# Patient Record
Sex: Male | Born: 1975 | Race: White | Hispanic: No | Marital: Married | Smoking: Never smoker
Health system: Southern US, Community
[De-identification: ages and names within clinical notes are randomized; demographics above are authoritative.]

## PROBLEM LIST (undated history)

## (undated) DIAGNOSIS — I10 Essential (primary) hypertension: Secondary | ICD-10-CM

## (undated) DIAGNOSIS — M419 Scoliosis, unspecified: Secondary | ICD-10-CM

## (undated) DIAGNOSIS — R569 Unspecified convulsions: Secondary | ICD-10-CM

## (undated) HISTORY — DX: Essential (primary) hypertension: I10

## (undated) HISTORY — DX: Unspecified convulsions: R56.9

## (undated) HISTORY — PX: FOOT SURGERY: SHX648

## (undated) HISTORY — PX: LUMBAR SPINE SURGERY: SHX701

## (undated) HISTORY — DX: Scoliosis, unspecified: M41.9

## (undated) HISTORY — PX: LEG SURGERY: SHX1003

## (undated) HISTORY — PX: OPEN ANTERIOR SHOULDER RECONSTRUCTION: SHX2100

## (undated) HISTORY — PX: BLADDER SURGERY: SHX569

---

## 2002-02-04 ENCOUNTER — Encounter: Payer: Self-pay | Admitting: Family Medicine

## 2002-02-04 ENCOUNTER — Encounter: Admission: RE | Admit: 2002-02-04 | Discharge: 2002-02-04 | Payer: Self-pay | Admitting: Family Medicine

## 2002-02-13 ENCOUNTER — Encounter: Payer: Self-pay | Admitting: Family Medicine

## 2002-02-13 ENCOUNTER — Encounter: Admission: RE | Admit: 2002-02-13 | Discharge: 2002-02-13 | Payer: Self-pay | Admitting: Family Medicine

## 2003-08-16 ENCOUNTER — Emergency Department (HOSPITAL_COMMUNITY): Admission: EM | Admit: 2003-08-16 | Discharge: 2003-08-16 | Payer: Self-pay | Admitting: Emergency Medicine

## 2009-01-28 ENCOUNTER — Emergency Department (HOSPITAL_COMMUNITY): Admission: EM | Admit: 2009-01-28 | Discharge: 2009-01-28 | Payer: Self-pay | Admitting: Emergency Medicine

## 2014-06-02 ENCOUNTER — Telehealth: Payer: Self-pay | Admitting: Neurology

## 2014-06-02 NOTE — Telephone Encounter (Signed)
Pt called an canceled new patient appt and did not resch. Referring dr office notified to

## 2014-06-03 ENCOUNTER — Ambulatory Visit: Payer: Self-pay | Admitting: Neurology

## 2014-11-26 ENCOUNTER — Other Ambulatory Visit: Payer: Self-pay | Admitting: Neurology

## 2014-11-26 DIAGNOSIS — G40109 Localization-related (focal) (partial) symptomatic epilepsy and epileptic syndromes with simple partial seizures, not intractable, without status epilepticus: Secondary | ICD-10-CM

## 2014-12-07 ENCOUNTER — Ambulatory Visit
Admission: RE | Admit: 2014-12-07 | Discharge: 2014-12-07 | Disposition: A | Discharge status: Home / Self Care | Payer: BLUE CROSS/BLUE SHIELD | Source: Ambulatory Visit | Attending: Neurology | Admitting: Neurology

## 2014-12-07 DIAGNOSIS — G40109 Localization-related (focal) (partial) symptomatic epilepsy and epileptic syndromes with simple partial seizures, not intractable, without status epilepticus: Secondary | ICD-10-CM

## 2014-12-07 MED ORDER — GADOBENATE DIMEGLUMINE 529 MG/ML IV SOLN
14.0000 mL | Freq: Once | INTRAVENOUS | Status: AC | PRN
  Administered 2014-12-07: 14 mL via INTRAVENOUS

## 2014-12-12 ENCOUNTER — Other Ambulatory Visit: Payer: Self-pay

## 2015-03-17 ENCOUNTER — Telehealth: Payer: Self-pay | Admitting: Family Medicine

## 2015-03-17 ENCOUNTER — Ambulatory Visit (INDEPENDENT_AMBULATORY_CARE_PROVIDER_SITE_OTHER): Payer: BLUE CROSS/BLUE SHIELD | Admitting: Neurology

## 2015-03-17 ENCOUNTER — Encounter: Payer: Self-pay | Admitting: Neurology

## 2015-03-17 VITALS — BP 108/70 | HR 101 | Resp 16 | Wt 139.0 lb

## 2015-03-17 DIAGNOSIS — M419 Scoliosis, unspecified: Secondary | ICD-10-CM | POA: Diagnosis not present

## 2015-03-17 DIAGNOSIS — G40009 Localization-related (focal) (partial) idiopathic epilepsy and epileptic syndromes with seizures of localized onset, not intractable, without status epilepticus: Secondary | ICD-10-CM

## 2015-03-17 MED ORDER — LEVETIRACETAM ER 500 MG PO TB24: ORAL_TABLET | ORAL | Status: DC

## 2015-03-17 NOTE — Patient Instructions (Signed)
1. Start Levetiracetam ER : Take 1 tablet at bedtime for 1 week, then increase to 2 tablets at bedtime 2. Continue taking Trileptal  2 tabs twice a day, then on week 3, start reducing to 1 tablet twice a day for 1 week, then 1/2 tablet twice a day for 1 week, then stop medication 3. Keep a calendar of your seizures 4. Follow-up in 3 months, call for any problems  Seizure Precautions: 1. If medication has been prescribed for you to prevent seizures, take it exactly as directed.  Do not stop taking the medicine without talking to your doctor first, even if you have not had a seizure in a long time.   2. Avoid activities in which a seizure would cause danger to yourself or to others.  Don't operate dangerous machinery, swim alone, or climb in high or dangerous places, such as on ladders, roofs, or girders.  Do not drive unless your doctor says you may.  3. If you have any warning that you may have a seizure, lay down in a safe place where you can't hurt yourself.    4.  No driving for 6 months from last seizure, as per [facility].   Please refer to the following link on the [employer]ile[employer]'s website for more information: http://www.epilepsyfoundation.org/answerplace/Social/driving/drivingu.cfm   5.  Maintain good sleep hygiene.  6.  Contact your doctor if you have any problems that may be related to the medicine you are taking.  7.  Call 911 and bring the patient back to the ED if:        A.  The seizure lasts longer than 5 minutes.       B.  The patient doesn't awaken shortly after the seizure  C.  The patient has new problems such as difficulty seeing, speaking or moving  D.  The patient was injured during the seizure  E.  The patient has a temperature over 102 F (39C)  F.  The patient vomited and now is having trouble breathing

## 2015-03-17 NOTE — Telephone Encounter (Signed)
Called patient & left msg on his vm to let him know that [facility] on [address] had Keppra XR in stock. Rx was called in to that pharmacy.

## 2015-03-17 NOTE — Progress Notes (Signed)
NEUROLOGY CONSULTATION NOTE  Billy [name] MRN: [id number] DOB: 09/12/1975  Referring provider: Dr. Denton Meek Primary care provider: [facility]  Reason for consult:  seizures  Dear Dr Modesto Charon:  Thank you for your kind referral of Billy [name] for consultation of the above symptoms. Although his history is well known to you, please allow me to reiterate it for the purpose of our medical record. Records and images were personally reviewed where available.  HISTORY OF PRESENT ILLNESS: This is a very pleasant 39 year old right-handed man with a history of severe scoliosis with congenital lower extremity musculoskeletal abnormalities, presenting to establish care for possible seizures. He reports that symptoms started in 2001 and have been fairly stereotyped since then. He would feel that "something just clicks," then he would have intrusive thoughts that he either dreamt of the the past, thoughts he had suppressed and would start remembering, or things that had happened when he was a child. This would last for 30-45 seconds, then feels his heart rate increase, his whole body starts warming up and he breaks into a sweat, then has a sensation to urinate. People around him would not notice the episode, he denies any confusion or unresponsiveness. Most of the time he can stay focused, but other times he does not feel completely focused and has had to excuse himself it they happened at work. He recalls the first event occurred while driving [place]country in [place]29, he had not slept for more than 2 days, he recalls feeling a little confused driving in the dark, he could not see anything and this freaked him out. He had another again while driving and had to pull over, then vomited. He denies any further episodes of vomiting since then. Initially, he was having the episodes 1-2 times a year, but then increased in frequency to once a month, clustering for 2-3 days. He would have 1-2  episodes the first day, then 3-5 episodes the next day, tapering down to 1-2 episodes on day 3, then stopping for another 30 days. Last episode was 02/17/15. After the first 2 episodes, he can have a mild 4/10 pressure-like headache in the frontal regions, with good response to Advil. He cannot identify any clear triggers, he denies any sleep deprivation and rarely drinks alcohol. He had previously been evaluated at [facility] by Dr. Kathrynn Ducking in 2007. Per notes, he was seen by [facility] in 2003 and had an EEG reported as normal. He was told he might have migraines and was started on Zonegran, which caused lethargy. At that time, symptoms were felt to be due to anxiety attacks and he was referred to [facility], however it appears he did not follow through on this. He reports that he is very laid-back and denies anxiety. He did not seek any medical attention for these stereotyped symptoms for 9 years, until January 2016 when he reported waking up with tongue bite with blood on his pillow and urinary incontinence twice at the end of 2015. He was started on uptitration of Trileptal, currently on  BID. He denies any change in the frequency or duration of these events since starting medication. He feels that since starting Trileptal, he has been having more cognitive problems with difficulties multitasking, and being more forgetful. He has always been easily distractible, but recently has forgotten to save pictures he does at work, one time he left a medication on the table. He forgets to grab certain things when getting ready in the morning. He  has also started having dizziness 45-60 minutes after taking Trileptal. He initially had tingling in both hands, which is not as bad but still occurring. He has noticed brief twitching in his extremities, which did not occur prior to starting Trileptal. He would be using the computer mouse then have a brief jerk, but more noticeable in his hands or arms when lying down.   He denies  any  olfactory/gustatory hallucinations, rising epigastric sensation, He lives alone, but denies being told at work of any staring/unresponsive episodes, no gaps in time. He has occasional numbness in his right hand when using the computer mouse. He has infrequent headaches with good response to Advil, no associated nausea/vomiting/photo or phonophobia. He denies any diplopia, dysarthria, dysphagia, neck/back pain. No falls. 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 His right leg has limited movement in the foot.   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 There is no history of febrile convulsions, CNS infections such as meningitis/encephalitis, significant traumatic brain injury, or family history of seizures.  Prior AEDs: Zonisamide EEGs: normal in 2003 per report MRI: I personally reviewed MRI brain with and without contrast done 11/2014 which did not show any acute abnormalities, hippocampi symmetric with no abnormal signal or enhancement. There was mild prominence of the lateral ventricles, felt to be normal anatomic variation.   PAST MEDICAL HISTORY: Past Medical History  Diagnosis Date  . Seizures   . Hypertension   . Scoliosis     PAST SURGICAL HISTORY: Past Surgical History  Procedure Laterality Date  . Open anterior shoulder reconstruction      Left  . Lumbar spine surgery      x 2  . Bladder surgery    . Leg surgery      left calf  . Foot surgery      left    MEDICATIONS: Trileptal 300mg  2 tabs BID Norvasc 5mg  daily Lisinopril 10mg  daily   No current facility-administered medications on file prior to visit.    ALLERGIES: No Known Allergies  FAMILY HISTORY: No family history on file.  SOCIAL HISTORY: Social History   Social History  . Marital Status: Married    Spouse Name: N/A  . Number of Children: N/A  .  Years of Education: N/A   Occupational History  . Eye Care    Social History Main Topics  . Smoking status: Never Smoker   . Smokeless tobacco: Never Used  . Alcohol Use: 0.0 oz/week    0 Standard drinks or equivalent per week     Comment: Rare  . Drug Use: No  . Sexual Activity: Not on file   Other Topics Concern  . Not on file   Social History Narrative    REVIEW OF SYSTEMS: Constitutional: No fevers, chills, or sweats, no generalized fatigue, change in appetite Eyes: No visual changes, double vision, eye pain Ear, nose and throat: No hearing loss, ear pain, nasal congestion, sore throat Cardiovascular: No chest pain, palpitations Respiratory:  No shortness of breath at rest or with exertion, wheezes GastrointestinaI: No nausea, vomiting, diarrhea, abdominal pain, fecal incontinence Genitourinary:  No dysuria, urinary retention or frequency Musculoskeletal:  No neck pain, back pain Integumentary: No rash, pruritus, skin lesions Neurological: as above Psychiatric: No depression, insomnia, anxiety Endocrine: No palpitations, fatigue, diaphoresis, mood swings, change in appetite, change in weight, increased thirst Hematologic/Lymphatic:  No anemia,  purpura, petechiae. Allergic/Immunologic: no itchy/runny eyes, nasal congestion, recent allergic reactions, rashes  PHYSICAL EXAM: Filed Vitals:   03/17/15 1251  BP: 108/70  Pulse: 101  Resp: 16   General: No acute distress Head:  Normocephalic/atraumatic Eyes: Fundoscopic exam shows bilateral sharp discs, no vessel changes, exudates, or hemorrhages Neck: supple, no paraspinal tenderness, full range of motion Back: No paraspinal tenderness Heart: regular rate and rhythm Lungs: Clear to auscultation bilaterally. Vascular: No carotid bruits. Skin/Extremities: No rash, no edema; severe atrophy in both LE, left > right calf Neurological Exam: Mental status: alert and oriented to person, place, and time, no dysarthria or  aphasia, Fund of knowledge is appropriate.  Recent and remote memory are intact. 2/3 delayed recall.  Attention and concentration are normal.    Able to name objects and repeat phrases. Cranial nerves: CN I: not tested CN II: pupils equal, round and reactive to light, visual fields intact, fundi unremarkable. CN III, IV, VI:  full range of motion, no nystagmus, no ptosis CN V: facial sensation intact CN VII: upper and lower face symmetric CN VIII: hearing intact to finger rub CN IX, X: gag intact, uvula midline CN XI: sternocleidomastoid and trapezius muscles intact CN XII: tongue midline Bulk & Tone: normal, no fasciculations. Motor: 5/5 on both UE, hip flexors 4/5 bilaterally, knee flexors/extensors, hip adduction/abduction 5/5, 0/5 left foot dorsiflexion and plantarflexion, 2/5 right foot dorsi/plantarflexion Sensation: intact to light touch, cold, pin, vibration sense. Reduced joint position sense.  No extinction to double simultaneous stimulation.  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 At the end of 2015, he woke up with tongue bite and incontinence twice, concerning for possible unwitnessed convulsions. He denies any similar nocturnal episodes since starting Trileptal, however the monthly stereotyped sensory spells are unchanged. He is having side effects on Trileptal and is interested in trying a different medication. Options were discussed, he will start Levetiracetam ER 500mg  qhs for 1  week, then increase to 1000mg  qhs. After a week on higher dose, he will start tapering off Trileptal. Side effects of Levetiracetam were discussed. If symptoms continue despite adequate doses of seizure medication, EEG will be ordered to further classify his events. [place] driving laws were discussed with the patient, and he knows to stop driving after an episode of loss of awareness/consciousness, until 6 months event-free. He will keep a calendar of his symptoms and will follow-up in 3 months. He knows to call our office for any problems in the interim.  Thank you for allowing me to participate in the care of this patient. Please do not hesitate to call for any questions or concerns.   Patrcia Dolly, M.D.  CC: Dr. Modesto Charon

## 2015-03-18 DIAGNOSIS — G40009 Localization-related (focal) (partial) idiopathic epilepsy and epileptic syndromes with seizures of localized onset, not intractable, without status epilepticus: Secondary | ICD-10-CM | POA: Insufficient documentation

## 2015-03-18 DIAGNOSIS — M419 Scoliosis, unspecified: Secondary | ICD-10-CM | POA: Insufficient documentation

## 2015-04-21 ENCOUNTER — Ambulatory Visit: Payer: BLUE CROSS/BLUE SHIELD | Admitting: Neurology

## 2015-06-18 ENCOUNTER — Ambulatory Visit: Payer: BLUE CROSS/BLUE SHIELD | Admitting: Neurology

## 2015-10-09 ENCOUNTER — Ambulatory Visit: Payer: BLUE CROSS/BLUE SHIELD | Admitting: Neurology

## 2015-10-12 ENCOUNTER — Ambulatory Visit (INDEPENDENT_AMBULATORY_CARE_PROVIDER_SITE_OTHER): Payer: BLUE CROSS/BLUE SHIELD | Admitting: Neurology

## 2015-10-12 ENCOUNTER — Encounter: Payer: Self-pay | Admitting: Neurology

## 2015-10-12 VITALS — BP 100/82 | HR 72 | Resp 14 | Wt 132.0 lb

## 2015-10-12 DIAGNOSIS — M419 Scoliosis, unspecified: Secondary | ICD-10-CM

## 2015-10-12 DIAGNOSIS — G40009 Localization-related (focal) (partial) idiopathic epilepsy and epileptic syndromes with seizures of localized onset, not intractable, without status epilepticus: Secondary | ICD-10-CM

## 2015-10-12 MED ORDER — LEVETIRACETAM ER 500 MG PO TB24: ORAL_TABLET | ORAL | Status: DC

## 2015-10-12 NOTE — Progress Notes (Signed)
NEUROLOGY FOLLOW UP OFFICE NOTE  Billy [name] [id number]  HISTORY OF PRESENT ILLNESS: I had the pleasure of seeing Billy [name] in follow-up in the neurology clinic on 10/13/2015.  The patient was last seen 7 months ago for recurrent seizures. On his last visit, he was reporting a 15-year history of relatively stereotyped episodes concerning for simple partial seizures with intrusive thoughts, deja vu sensation. At the end of 2015, he woke up with tongue bite and incontinence twice, concerning for possible unwitnessed convulsions. He continued to have these episodes on Trileptal and was switched to Keppra XR. On  dose, the sensations had stopped, and he decided to stay on low dose. He tapered off Trileptal. He was doing well with no symptoms until the beginning of March when he again woke up with urinary incontinence. He then increased to Keppra XR  daily. He denies any side effects on Keppra. He denies any headaches, dizziness, vision changes, focal numbness/tingling/weakness, staring/unresponsive episodes, olfactory/gustatory hallucinations.   HPI 03/17/15: This is a very pleasant 40 yo RH man with a history of severe scoliosis with congenital lower extremity musculoskeletal abnormalities, with a history suggestive of recurrent simple partial seizures. He reports that symptoms started in 2001 and have been fairly stereotyped since then. He would feel that "something just clicks," then he would have intrusive thoughts that he either dreamt of the the past, thoughts he had suppressed and would start remembering, or things that had happened when he was a child. This would last for 30-45 seconds, then feels his heart rate increase, his whole body starts warming up and he breaks into a sweat, then has a sensation to urinate. People around him would not notice the episode, he denies any confusion or unresponsiveness. Most of the time he can stay focused, but other times he does not feel  completely focused and has had to excuse himself it they happened at work. He recalls the first event occurred while driving cross[place]country in 2130, he had not slept for more than 2 days, he recalls feeling a little confused driving in the dark, he could not see anything and this freaked him out. He had another again while driving and had to pull over, then vomited. He denies any further episodes of vomiting since then. Initially, he was having the episodes 1-2 times a year, but then increased in frequency to once a month, clustering for 2-3 days. He would have 1-2 episodes the first day, then 3-5 episodes the next day, tapering down to 1-2 episodes on day 3, then stopping for another 30 days. Last episode was 02/17/15. After the first 2 episodes, he can have a mild 4/10 pressure-like headache in the frontal regions, with good response to Advil. He cannot identify any clear triggers, he denies any sleep deprivation and rarely drinks alcohol. He had previously been evaluated at [facility] by Dr. Kathrynn Ducking in 2007. Per notes, he was seen by [facility] in 2003 and had an EEG reported as normal. He was told he might have migraines and was started on Zonegran, which caused lethargy. At that time, symptoms were felt to be due to anxiety attacks and he was referred to [facility], however it appears he did not follow through on this. He reports that he is very laid-back and denies anxiety. He did not seek any medical attention for these stereotyped symptoms for 9 years, until January 2016 when he reported waking up with tongue bite with blood on his pillow and urinary incontinence twice at  the end of 2015. He was started on uptitration of Trileptal, currently on 600mg  BID. He denies any change in the frequency or duration of these events since starting medication. He feels that since starting Trileptal, he has been having more cognitive problems with difficulties multitasking, and being more forgetful. He has always been easily  distractible, but recently has forgotten to save pictures he does at work, one time he left a medication on the table. He forgets to grab certain things when getting ready in the morning. He has also started having dizziness 45-60 minutes after taking Trileptal. He initially had tingling in both hands, which is not as bad but still occurring. He has noticed brief twitching in his extremities, which did not occur prior to starting Trileptal. He would be using the computer mouse then have a brief jerk, but more noticeable in his hands or arms when lying down.   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 His right leg has limited movement in the foot. There is no history of febrile convulsions, CNS infections such as meningitis/encephalitis, significant traumatic brain injury, or family history of seizures.  Prior AEDs: Zonisamide EEGs: normal in 2003 per report MRI: I personally reviewed MRI brain with and without contrast done 11/2014 which did not show any acute abnormalities, hippocampi symmetric with no abnormal signal or enhancement. There was mild prominence of the lateral ventricles, felt to be normal anatomic variation.  PAST MEDICAL HISTORY: Past Medical History  Diagnosis Date  . Seizures (HCC)   . Hypertension   . Scoliosis     MEDICATIONS: Current Outpatient Prescriptions on File Prior to Visit  Medication Sig Dispense Refill  . levETIRAcetam (KEPPRA XR) 500 MG 24 hr tablet Take 1 tablet at bedtime for 1 week, then increase to 2 tablets at bedtime (Patient taking differently: Take 2 tablets daily) 60 tablet 4   No current facility-administered medications on file prior to visit.    ALLERGIES: No Known Allergies  FAMILY HISTORY: No family history on file.  SOCIAL HISTORY: Social History    Social History  . Marital Status: Married    Spouse Name: N/A  . Number of Children: N/A  . Years of Education: N/A   Occupational History  . Eye Care    Social History Main Topics  . Smoking status: Never Smoker   . Smokeless tobacco: Never Used  . Alcohol Use: 0.0 oz/week    0 Standard drinks or equivalent per week     Comment: Rare  . Drug Use: No  . Sexual Activity: Not on file   Other Topics Concern  . Not on file   Social History Narrative    REVIEW OF SYSTEMS: Constitutional: No fevers, chills, or sweats, no generalized fatigue, change in appetite Eyes: No visual changes, double vision, eye pain Ear, nose and throat: No hearing loss, ear pain, nasal congestion, sore throat Cardiovascular: No chest pain, palpitations Respiratory:  No shortness of breath at rest or with exertion, wheezes GastrointestinaI: No nausea, vomiting, diarrhea, abdominal pain, fecal incontinence Genitourinary:  No dysuria, urinary retention or frequency Musculoskeletal:  No neck pain, back pain Integumentary: No rash, pruritus, skin lesions Neurological: as above Psychiatric: No depression, insomnia, anxiety Endocrine: No palpitations, fatigue, diaphoresis, mood swings, change in appetite, change in weight, increased thirst Hematologic/Lymphatic:  No anemia, purpura, petechiae. Allergic/Immunologic: no  itchy/runny eyes, nasal congestion, recent allergic reactions, rashes  PHYSICAL EXAM: Filed Vitals:   10/12/15 1545  BP: 100/82  Pulse: 72  Resp: 14   General: No acute distress Head: Normocephalic/atraumatic Neck: supple, no paraspinal tenderness, full range of motion Back: No paraspinal tenderness Heart: regular rate and rhythm Lungs: Clear to auscultation bilaterally. Vascular: No carotid bruits. Skin/Extremities: No rash, no edema; severe atrophy in both LE, left > right calf Neurological Exam: Mental status: alert and oriented to person, place, and time, no dysarthria or  aphasia, Fund of knowledge is appropriate. Recent and remote memory are intact. 2/3 delayed recall. Attention and concentration are normal. Able to name objects and repeat phrases. Cranial nerves: CN I: not tested CN II: pupils equal, round and reactive to light, visual fields intact, fundi unremarkable. CN III, IV, VI: full range of motion, no nystagmus, no ptosis CN V: facial sensation intact CN VII: upper and lower face symmetric CN VIII: hearing intact to finger rub CN IX, X: gag intact, uvula midline CN XI: sternocleidomastoid and trapezius muscles intact CN XII: tongue midline Bulk & Tone: normal, no fasciculations. Motor: 5/5 on both UE, hip flexors 4/5 bilaterally, knee flexors/extensors, hip adduction/abduction 5/5, 0/5 left foot dorsiflexion and plantarflexion, 2/5 right foot dorsi/plantarflexion (unchanged) Sensation: intact to light touch. 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 At the end of 2015, he woke up with tongue bite and incontinence twice, concerning for possible unwitnessed convulsions. The stereotyped episodes stopped once he started Keppra XR. He continued on low dose  daily until he woke up with urinary incontinence at the beginning of March, and increased dose to  qhs. Continue current dose and monitor symptoms. If symptoms continue despite adequate doses of seizure medication, EEG will be ordered to further classify his events. [place] driving laws were discussed with the patient, and he knows to stop  driving after an episode of loss of awareness/consciousness, until 6 months event-free. He will follow-up in 6 months.   Thank you for allowing me to participate in his care.  Please do not hesitate to call for any questions or concerns.  The duration of this appointment visit was 25 minutes of face-to-face time with the patient.  Greater than 50% of this time was spent in counseling, explanation of diagnosis, planning of further management, and coordination of care.   Patrcia Dolly, M.D.

## 2015-10-12 NOTE — Patient Instructions (Addendum)
1. Continue Levetiracetam ER 500mg : Take 2 tablets at night 2. Recommend contacting DMV regarding driving restrictions 3. Follow-up in 6 months, call for any changes  Seizure Precautions: 1. If medication has been prescribed for you to prevent seizures, take it exactly as directed.  Do not stop taking the medicine without talking to your doctor first, even if you have not had a seizure in a long time.   2. Avoid activities in which a seizure would cause danger to yourself or to others.  Don't operate dangerous machinery, swim alone, or climb in high or dangerous places, such as on ladders, roofs, or girders.  Do not drive unless your doctor says you may.  3. If you have any warning that you may have a seizure, lay down in a safe place where you can't hurt yourself.    4.  No driving for 6 months from last seizure, [place] [facility].   Please refer to the following link on the [employer]ile[employer] of [employer]'s website for more information: http://www.epilepsyfoundation.org/answerplace/Social/driving/drivingu.cfm   5.  Maintain good sleep hygiene. Avoid alcohol.  6.  Contact your doctor if you have any problems that may be related to the medicine you are taking.  7.  Call 911 and bring the patient back to the ED if:        A.  The seizure lasts longer than 5 minutes.       B.  The patient doesn't awaken shortly after the seizure  C.  The patient has new problems such as difficulty seeing, speaking or moving  D.  The patient was injured during the seizure  E.  The patient has a temperature over 102 F (39C)  F.  The patient vomited and now is having trouble breathing

## 2015-10-13 ENCOUNTER — Encounter: Payer: Self-pay | Admitting: Neurology

## 2016-05-30 ENCOUNTER — Ambulatory Visit (INDEPENDENT_AMBULATORY_CARE_PROVIDER_SITE_OTHER): Payer: BLUE CROSS/BLUE SHIELD | Admitting: Neurology

## 2016-05-30 ENCOUNTER — Encounter: Payer: Self-pay | Admitting: Neurology

## 2016-05-30 VITALS — BP 126/82 | HR 71 | Wt 132.4 lb

## 2016-05-30 DIAGNOSIS — G40009 Localization-related (focal) (partial) idiopathic epilepsy and epileptic syndromes with seizures of localized onset, not intractable, without status epilepticus: Secondary | ICD-10-CM

## 2016-05-30 MED ORDER — LEVETIRACETAM ER 500 MG PO TB24: ORAL_TABLET | ORAL | 3 refills | Status: DC

## 2016-05-30 NOTE — Progress Notes (Signed)
NEUROLOGY FOLLOW UP OFFICE NOTE  Erskin BurnetHarry S Chuong [id number]  HISTORY OF PRESENT ILLNESS: I had the pleasure of seeing Billy [name] in follow-up in the neurology clinic on 05/30/2016.  The patient was last seen 7 months ago for recurrent seizures. He has a history of relatively stereotyped episodes concerning for simple partial seizures with intrusive thoughts, deja vu sensation. At the end of 2015, he woke up with tongue bite and incontinence twice, concerning for possible unwitnessed convulsions. He continued to have these episodes on Trileptal and was switched to Keppra XR. On 500mg  dose, the sensations had stopped, and he decided to stay on low dose. He tapered off Trileptal. He was doing well with no symptoms until the beginning of March when he again woke up with urinary incontinence. He then increased to Keppra XR 1000mg  daily. No further similar episodes. He is doing well with no side effects on Keppra. He denies any headaches, dizziness, vision changes, focal numbness/tingling/weakness, staring/unresponsive episodes, olfactory/gustatory hallucinations. He has occasional falls but no injuries.  HPI 03/17/15: This is a very pleasant 40 yo RH man with a history of severe scoliosis with congenital lower extremity musculoskeletal abnormalities, with a history suggestive of recurrent simple partial seizures. He reports that symptoms started in 2001 and have been fairly stereotyped since then. He would feel that "something just clicks," then he would have intrusive thoughts that he either dreamt of the the past, thoughts he had suppressed and would start remembering, or things that had happened when he was a child. This would last for 30-45 seconds, then feels his heart rate increase, his whole body starts warming up and he breaks into a sweat, then has a sensation to urinate. People around him would not notice the episode, he denies any confusion or unresponsiveness. Most of the time he can stay  focused, but other times he does not feel completely focused and has had to excuse himself it they happened at work. He recalls the first event occurred [place] in [id number], he had not slept for more than 2 days, he recalls feeling a little confused driving in the dark, he could not see anything and this freaked him out. He had another again while driving and had to pull over, then vomited. He denies any further episodes of vomiting since then. Initially, he was having the episodes 1-2 times a year, but then increased in frequency to once a month, clustering for 2-3 days. He would have 1-2 episodes the first day, then 3-5 episodes the next day, tapering down to 1-2 episodes on day 3, then stopping for another 30 days. Last episode was 02/17/15. After the first 2 episodes, he can have a mild 4/10 pressure-like headache in the frontal regions, with good response to Advil. He cannot identify any clear triggers, he denies any sleep deprivation and rarely drinks alcohol. [facility] by Dr[facility] Kathrynn DuckingHurwitz in 2007. Per notes, he was seen by [facility] in 2003 and had an EEG reported as normal. He was told he might have migraines and was started on Zonegran, which caused lethargy. At that time, symptoms were felt to be due to anxiety attacks and he was [facility], however it appears he did not follow through on this. He reports that he is very laid-back and denies anxiety. He did not seek any medical attention for these stereotyped symptoms for 9 years, until January 2016 when he reported waking up with tongue bite with blood on  his pillow and urinary incontinence twice at the end of 2015. He was started on uptitration of Trileptal, currently on 600mg  BID. He denies any change in the frequency or duration of these events since starting medication. He feels that since starting Trileptal, he has been having more cognitive problems with difficulties multitasking, and being more  forgetful. He has always been easily distractible, but recently has forgotten to save pictures he does at work, one time he left a medication on the table. He forgets to grab certain things when getting ready in the morning. He has also started having dizziness 45-60 minutes after taking Trileptal. He initially had tingling in both hands, which is not as bad but still occurring. He has noticed brief twitching in his extremities, which did not occur prior to starting Trileptal. He would be using the computer mouse then have a brief jerk, but more noticeable in his hands or arms when lying down.   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 His right leg has limited movement in the foot. There is no history of febrile convulsions, CNS infections such as meningitis/encephalitis, significant traumatic brain injury, or family history of seizures.  Prior AEDs: Zonisamide EEGs: normal in 2003 per report MRI: I personally reviewed MRI brain with and without contrast done 11/2014 which did not show any acute abnormalities, hippocampi symmetric with no abnormal signal or enhancement. There was mild prominence of the lateral ventricles, felt to be normal anatomic variation.  PAST MEDICAL HISTORY: Past Medical History:  Diagnosis Date  . Hypertension   . Scoliosis   . Seizures (HCC)     MEDICATIONS: Current Outpatient Prescriptions on File Prior to Visit  Medication Sig Dispense Refill  . levETIRAcetam (KEPPRA XR) 500 MG 24 hr tablet Take 2 tablets at bedtime 180 tablet 3   No current facility-administered medications on file prior to visit.     ALLERGIES: No Known Allergies  FAMILY HISTORY: No family history on file.  SOCIAL HISTORY: Social History   Social History  . Marital status: Married    Spouse  name: N/A  . Number of children: N/A  . Years of education: N/A   Occupational History  . [employer]    Social History Main Topics  . Smoking status: Never Smoker  . Smokeless tobacco: Never Used  . Alcohol use 0.0 oz/week     Comment: Rare  . Drug use: No  . Sexual activity: Not on file   Other Topics Concern  . Not on file   Social History Narrative  . No narrative on file    REVIEW OF SYSTEMS: Constitutional: No fevers, chills, or sweats, no generalized fatigue, change in appetite Eyes: No visual changes, double vision, eye pain Ear, nose and throat: No hearing loss, ear pain, nasal congestion, sore throat Cardiovascular: No chest pain, palpitations Respiratory:  No shortness of breath at rest or with exertion, wheezes GastrointestinaI: No nausea, vomiting, diarrhea, abdominal pain, fecal incontinence Genitourinary:  No dysuria, urinary retention or frequency Musculoskeletal:  No neck pain, back pain Integumentary: No rash, pruritus, skin lesions Neurological: as above Psychiatric: No depression, insomnia, anxiety Endocrine: No palpitations, fatigue, diaphoresis, mood swings, change in appetite, change in weight, increased thirst Hematologic/Lymphatic:  No anemia, purpura, petechiae. Allergic/Immunologic: no itchy/runny eyes, nasal congestion, recent allergic reactions, rashes  PHYSICAL EXAM: Vitals:  05/30/16 1559  BP: 126/82  Pulse: 71   General: No acute distress Head: Normocephalic/atraumatic Neck: supple, no paraspinal tenderness, full range of motion Back: No paraspinal tenderness Heart: regular rate and rhythm Lungs: Clear to auscultation bilaterally. Vascular: No carotid bruits. Skin/Extremities: No rash, no edema; severe atrophy in both LE, left > right calf (unchanged) Neurological Exam: Mental status: alert and oriented to person, place, and time, no dysarthria or aphasia, Fund of knowledge is appropriate. Recent and remote memory are intact. 3/3  delayed recall. Attention and concentration are normal. Able to name objects and repeat phrases. Cranial nerves: CN I: not tested CN II: pupils equal, round and reactive to light, visual fields intact, fundi unremarkable. CN III, IV, VI: full range of motion, no nystagmus, no ptosis CN V: facial sensation intact CN VII: upper and lower face symmetric CN VIII: hearing intact to finger rub CN IX, X: gag intact, uvula midline CN XI: sternocleidomastoid and trapezius muscles intact CN XII: tongue midline Bulk & Tone: normal, no fasciculations. Motor: 5/5 on both UE, hip flexors 4/5 bilaterally, knee flexors/extensors, hip adduction/abduction 5/5, 0/5 left foot dorsiflexion and plantarflexion, 2/5 right foot dorsi/plantarflexion (unchanged) Sensation: intact to light touch. Romberg test negative Deep Tendon Reflexes: +2 on both UE, bilateral patella, absent ankle jerks bilaterally Cerebellar: no incoordination on finger to nose testing Gait: wide-based, steppage gait favoring the left leg (similar to prior) Tremor: none  IMPRESSION: This is a pleasant 40 yo RH man with a history of severe scoliosis with bilateral leg deformities, with a history of relatively stereotyped episodes concerning for simple partial seizures. At the end of 2015, he woke up with tongue bite and incontinence twice, concerning for possible unwitnessed convulsions. The stereotyped episodes stopped once he started Keppra XR. He continued on low dose 500mg  daily until he woke up with urinary incontinence at the beginning of March 2017, and increased dose to 1000mg  qhs. No further similar symptoms. Continue current dose. If symptoms continue despite adequate doses of seizure medication, EEG will be ordered to further classify his events. He is aware of [place] driving laws to stop driving after an episode of loss of awareness/consciousness, until 6 months event-free. He will follow-up in 1 year and knows to call for any changes.    Thank you for allowing me to participate in his care.  Please do not hesitate to call for any questions or concerns.  The duration of this appointment visit was 15 minutes of face-to-face time with the patient.  Greater than 50% of this time was spent in counseling, explanation of diagnosis, planning of further management, and coordination of care.   Patrcia DollyKaren Tana Trefry, M.D.    CC: [facility]

## 2016-05-30 NOTE — Patient Instructions (Addendum)
1. Continue Keppra XR 500mg : take 2 tablets daily 2. Follow-up in 1 year, call for any changes  Seizure Precautions: 1. If medication has been prescribed for you to prevent seizures, take it exactly as directed.  Do not stop taking the medicine without talking to your doctor first, even if you have not had a seizure in a long time.   2. Avoid activities in which a seizure would cause danger to yourself or to others.  Don't operate dangerous machinery, swim alone, or climb in high or dangerous places, such as on ladders, roofs, or girders.  Do not drive unless your doctor says you may.  3. If you have any warning that you may have a seizure, lay down in a safe place where you can't hurt yourself.    4.  No driving for 6 months from last seizure, as per [facility] [place] state law.   Please refer to the following link on the Epilepsy Foundation of America's website for more information: http://www.epilepsyfoundation.org/answerplace/Social/driving/drivingu.cfm   5.  Maintain good sleep hygiene. Avoid alcohol.  6.  Contact your doctor if you have any problems that may be related to the medicine you are taking.  7.  Call 911 and bring the patient back to the ED if:        A.  The seizure lasts longer than 5 minutes.       B.  The patient doesn't awaken shortly after the seizure  C.  The patient has new problems such as difficulty seeing, speaking or moving  D.  The patient was injured during the seizure  E.  The patient has a temperature over 102 F (39C)  F.  The patient vomited and now is having trouble breathing

## 2016-11-07 ENCOUNTER — Telehealth: Payer: Self-pay | Admitting: Neurology

## 2016-11-07 NOTE — Telephone Encounter (Signed)
Will forward message to provider. 

## 2016-11-07 NOTE — Telephone Encounter (Signed)
Pt called wanting an appt. - I gave him one for 12/09/16.  He then told me he was having seizures.  1 a day for the last few days right before it is time for his meds.  He has been under a lot of stress lately, but also wondered if his meds needs to be increased. Please advise.

## 2016-11-08 NOTE — Telephone Encounter (Signed)
Is the Keppra making him drowsy? If not, let's increase Keppra XR : Take 1 tab in AM, 2 tabs in PM. If he is drowsy on it, then we will just increase the night dose to 3 tabs qhs. Thanks

## 2016-11-08 NOTE — Telephone Encounter (Signed)
Clld pt - LOVMTC re provider's notations.

## 2016-12-09 ENCOUNTER — Ambulatory Visit: Payer: BLUE CROSS/BLUE SHIELD | Admitting: Neurology

## 2016-12-09 DIAGNOSIS — Z029 Encounter for administrative examinations, unspecified: Secondary | ICD-10-CM

## 2016-12-12 ENCOUNTER — Encounter: Payer: Self-pay | Admitting: Neurology

## 2017-02-10 ENCOUNTER — Other Ambulatory Visit: Payer: Self-pay

## 2017-02-10 DIAGNOSIS — G40009 Localization-related (focal) (partial) idiopathic epilepsy and epileptic syndromes with seizures of localized onset, not intractable, without status epilepticus: Secondary | ICD-10-CM

## 2017-02-10 MED ORDER — LEVETIRACETAM ER 500 MG PO TB24
ORAL_TABLET | ORAL | 3 refills | Status: DC
Start: 2017-02-10 — End: 2017-06-05

## 2017-06-05 ENCOUNTER — Ambulatory Visit: Payer: BLUE CROSS/BLUE SHIELD | Admitting: Neurology

## 2017-06-05 ENCOUNTER — Encounter: Payer: Self-pay | Admitting: Neurology

## 2017-06-05 DIAGNOSIS — G40009 Localization-related (focal) (partial) idiopathic epilepsy and epileptic syndromes with seizures of localized onset, not intractable, without status epilepticus: Secondary | ICD-10-CM

## 2017-06-05 MED ORDER — LEVETIRACETAM ER 500 MG PO TB24: ORAL_TABLET | ORAL | 3 refills | Status: DC

## 2017-06-05 NOTE — Patient Instructions (Signed)
Great seeing you! Continue Keppra XR 500mg : Take 1 tab in AM, 2 tabs in PM. Follow-up in 1 year, call for any changes  Seizure Precautions: 1. If medication has been prescribed for you to prevent seizures, take it exactly as directed.  Do not stop taking the medicine without talking to your doctor first, even if you have not had a seizure in a long time.   2. Avoid activities in which a seizure would cause danger to yourself or to others.  Don't operate dangerous machinery, swim alone, or climb in high or dangerous places, such as on ladders, roofs, or girders.  Do not drive unless your doctor says you may.  3. If you have any warning that you may have a seizure, lay down in a safe place where you can't hurt yourself.    4.  No driving for 6 months from last seizure, as per [facility] [place].   Please refer to the following link on the Epilepsy Foundation of [employer]'s website for more information: http://www.epilepsyfoundation.org/answerplace/Social/driving/drivingu.cfm   5.  Maintain good sleep hygiene. Avoid alcohol.  6.  Contact your doctor if you have any problems that may be related to the medicine you are taking.  7.  Call 911 and bring the patient back to the ED if:        A.  The seizure lasts longer than 5 minutes.       B.  The patient doesn't awaken shortly after the seizure  C.  The patient has new problems such as difficulty seeing, speaking or moving  D.  The patient was injured during the seizure  E.  The patient has a temperature over 102 F (39C)  F.  The patient vomited and now is having trouble breathing

## 2017-06-05 NOTE — Progress Notes (Signed)
NEUROLOGY FOLLOW UP OFFICE NOTE  Billy BurnetHarry S [name] [id number]  HISTORY OF PRESENT ILLNESS: I had the pleasure of seeing Billy [name] in follow-up in the neurology clinic on 06/05/2017.  The patient was last seen a year ago for recurrent seizures. He has a history of relatively stereotyped episodes concerning for simple partial seizures with intrusive thoughts, deja vu sensation. He had unwitnessed events in 2015 and March 2017 concerning for seizure waking up with urinary incontinence. Keppra dose increased, he was on Keppra XR 1000mg  daily when he called our office last 11/07/16 that he was having one seizure a day for the last few days right before it is time for his medications. He was under a lot of stress at that time. He reports they were less than a minute with no loss of awareness. Dose of Keppra increased to 500mg  in AM, 1000mg  in PM. He denies any further seizures since April 2018, no side effects on higher dose. He usually gets 6.5 hours of sleep. He denies any headaches, dizziness, vision changes, focal numbness/tingling/weakness, staring/unresponsive episodes, olfactory/gustatory hallucinations. No falls. He briefly had some mild nagging frontal headaches when he was off BP medications, this resolved with restarting medications.   HPI 03/17/15: This is a very pleasant 41 yo RH man with a history of severe scoliosis with congenital lower extremity musculoskeletal abnormalities, with a history suggestive of recurrent simple partial seizures. He reports that symptoms started in 2001 and have been fairly stereotyped since then. He would feel that "something just clicks," then he would have intrusive thoughts that he either dreamt of the the past, thoughts he had suppressed and would start remembering, or things that had happened when he was a child. This would last for 30-45 seconds, then feels his heart rate increase, his whole body starts warming up and he breaks into a sweat, then has a sensation  to urinate. People around him would not notice the episode, he denies any confusion or unresponsiveness. Most of the time he can stay focused, but other times he does not feel completely focused and has had to excuse himself it they happened at work. He recalls the first event occurred while [place] in [id number], he had not slept for more than 2 days, he recalls feeling a little confused driving in the dark, he could not see anything and this freaked him out. He had another again while driving and had to pull over, then vomited. He denies any further episodes of vomiting since then. Initially, he was having the episodes 1-2 times a year, but then increased in frequency to once a month, clustering for 2-3 days. He would have 1-2 episodes the first day, then 3-5 episodes the next day, tapering down to 1-2 episodes on day 3, then stopping for another 30 days. Last episode was 02/17/15. After the first 2 episodes, he can have a mild 4/10 pressure-like headache in the frontal regions, with good response to Advil. He cannot identify any clear triggers, he denies any sleep deprivation and rarely drinks alcohol. He had [facility] by Dr. Kathrynn DuckingHurwitz in 2007. Per notes, he was seen by [facility] in 2003 and had an EEG reported as normal. He was told he might have migraines and was started on Zonegran, which caused lethargy. At that time, symptoms were felt to be due to anxiety attacks and he was referred to [facility], however it appears he did not follow through on this. He reports that he is very laid-back  and denies anxiety. He did not seek any medical attention for these stereotyped symptoms for 9 years, until January 2016 when he reported waking up with tongue bite with blood on his pillow and urinary incontinence twice at the end of 2015. He was started on uptitration of Trileptal, currently on 600mg  BID. He denies any change in the frequency or duration of these events since starting medication.  He feels that since starting Trileptal, he has been having more cognitive problems with difficulties multitasking, and being more forgetful. He has always been easily distractible, but recently has forgotten to save pictures he does at work, one time he left a medication on the table. He forgets to grab certain things when getting ready in the morning. He has also started having dizziness 45-60 minutes after taking Trileptal. He initially had tingling in both hands, which is not as bad but still occurring. He has noticed brief twitching in his extremities, which did not occur prior to starting Trileptal. He would be using the computer mouse then have a brief jerk, but more noticeable in his hands or arms when lying down.   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 His right leg has limited movement in the foot. There is no history of febrile convulsions, CNS infections such as meningitis/encephalitis, significant traumatic brain injury, or family history of seizures.  Prior AEDs: Zonisamide EEGs: normal in 2003 per report MRI: I personally reviewed MRI brain with and without contrast done 11/2014 which did not show any acute abnormalities, hippocampi symmetric with no abnormal signal or enhancement. There was mild prominence of the lateral ventricles, felt to be normal anatomic variation.  PAST MEDICAL HISTORY: Past Medical History:  Diagnosis Date  . Hypertension   . Scoliosis   . Seizures (HCC)     MEDICATIONS: Current Outpatient Medications on File Prior to Visit  Medication Sig Dispense Refill  . amLODipine (NORVASC) 5 MG tablet TAKE 1 TABLET BY MOUTH EVERY DAY    . levETIRAcetam (KEPPRA XR) 500 MG 24 hr tablet Take 1 tablet by mouth in the morning.  Take 2 tablets by mouth in the evening 270 tablet 3    . lisinopril (PRINIVIL,ZESTRIL) 10 MG tablet TAKE 1 TABLET BY MOUTH EVERY DAY OFFICE VISIT DUE NOW  0   No current facility-administered medications on file prior to visit.     ALLERGIES: No Known Allergies  FAMILY HISTORY: No family history on file.  SOCIAL HISTORY: Social History   Socioeconomic History  . Marital status: Married    Spouse name: Not on file  . Number of children: Not on file  . Years of education: Not on file  . Highest education level: Not on file  Social Needs  . Financial resource strain: Not on file  . Food insecurity - worry: Not on file  . Food insecurity - inability: Not on file  . Transportation needs - medical: Not on file  . Transportation needs - non-medical: Not on file  Occupational History  . 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 Smoking status: Never Smoker  . Smokeless tobacco: Never Used  Substance and Sexual Activity  . Alcohol use: Yes    Alcohol/week: 0.0 oz    Comment: Rare  . Drug use: No  . Sexual activity: Not on file  Other Topics Concern  .  Not on file  Social History Narrative  . Not on file    REVIEW OF SYSTEMS: Constitutional: No fevers, chills, or sweats, no generalized fatigue, change in appetite Eyes: No visual changes, double vision, eye pain Ear, nose and throat: No hearing loss, ear pain, nasal congestion, sore throat Cardiovascular: No chest pain, palpitations Respiratory:  No shortness of breath at rest or with exertion, wheezes GastrointestinaI: No nausea, vomiting, diarrhea, abdominal pain, fecal incontinence Genitourinary:  No dysuria, urinary retention or frequency Musculoskeletal:  No neck pain, back pain Integumentary: No rash, pruritus, skin lesions Neurological: as above Psychiatric: No depression, insomnia, anxiety Endocrine: No palpitations, fatigue, diaphoresis, mood swings, change in appetite, change in weight, increased thirst Hematologic/Lymphatic:  No anemia, purpura,  petechiae. Allergic/Immunologic: no itchy/runny eyes, nasal congestion, recent allergic reactions, rashes  PHYSICAL EXAM: Vitals:   06/05/17 1559  BP: 116/76  Pulse: 99  SpO2: 97%   General: No acute distress Head: Normocephalic/atraumatic Neck: supple, no paraspinal tenderness, full range of motion Back: No paraspinal tenderness Heart: regular rate and rhythm Lungs: Clear to auscultation bilaterally. Vascular: No carotid bruits. Skin/Extremities: No rash, no edema; severe atrophy in both LE, left > right calf (unchanged) Neurological Exam: Mental status: alert and oriented to person, place, and time, no dysarthria or aphasia, Fund of knowledge is appropriate. Recent and remote memory are intact. 3/3 delayed recall. Attention and concentration are normal. Able to name objects and repeat phrases. Cranial nerves: CN I: not tested CN II: pupils equal, round and reactive to light, visual fields intact, fundi unremarkable. CN III, IV, VI: full range of motion, no nystagmus, no ptosis CN V: facial sensation intact CN VII: upper and lower face symmetric CN VIII: hearing intact to finger rub CN IX, X: gag intact, uvula midline CN XI: sternocleidomastoid and trapezius muscles intact CN XII: tongue midline Bulk & Tone: normal, no fasciculations. Motor: 5/5 on both UE, hip flexors 4/5 bilaterally, knee flexors/extensors, hip adduction/abduction 5/5, 0/5 left foot dorsiflexion and plantarflexion, 2/5 right foot dorsi/plantarflexion (unchanged) Sensation: intact to light touch. Romberg test negative Deep Tendon Reflexes: +2 on both UE, bilateral patella, absent ankle jerks bilaterally Cerebellar: no incoordination on finger to nose testing Gait: wide-based, steppage gait favoring the left leg (similar to prior) Tremor: none  IMPRESSION: This is a pleasant 41 yo RH man with a history of severe scoliosis with bilateral leg deformities, with a history of relatively stereotyped episodes  concerning for simple partial seizures. At the end of 2015, he woke up with tongue bite and incontinence twice, concerning for possible unwitnessed convulsions. The stereotyped episodes stopped once he started Keppra XR. He continued on low dose 500mg  daily until he woke up with urinary incontinence at the beginning of March 2017, and increased dose to 1000mg  qhs. Dose again increased to 500mg  in AM, 1000mg  in PM after a few small seizures in April 2018. He has been doing well since, no side effects. If symptoms continue despite adequate doses of seizure medication, EEG will be ordered to further classify his events. He is aware of [place] driving laws to stop driving after an episode of loss of awareness/consciousness, until 6 months event-free. He will follow-up in 1 year and knows to call for any changes.   Thank you for allowing me to participate in his care.  Please do not hesitate to call for any questions or concerns.  The duration of this appointment visit was 15 minutes of face-to-face time with the patient.  Greater than 50% of  this time was spent in counseling, explanation of diagnosis, planning of further management, and coordination of care.   Patrcia Dolly, M.D.    CC: [facility] at [facility]

## 2018-06-04 ENCOUNTER — Ambulatory Visit: Payer: BLUE CROSS/BLUE SHIELD | Admitting: Neurology

## 2018-06-11 ENCOUNTER — Ambulatory Visit
Admission: RE | Admit: 2018-06-11 | Discharge: 2018-06-11 | Disposition: A | Discharge status: Home / Self Care | Payer: 59 | Source: Ambulatory Visit | Attending: Family Medicine | Admitting: Family Medicine

## 2018-06-11 ENCOUNTER — Other Ambulatory Visit: Payer: Self-pay | Admitting: Family Medicine

## 2018-06-11 DIAGNOSIS — R05 Cough: Secondary | ICD-10-CM

## 2018-06-11 DIAGNOSIS — R058 Other specified cough: Secondary | ICD-10-CM

## 2018-06-25 ENCOUNTER — Other Ambulatory Visit: Payer: Self-pay

## 2018-06-25 ENCOUNTER — Ambulatory Visit: Payer: 59 | Admitting: Neurology

## 2018-06-25 ENCOUNTER — Encounter: Payer: Self-pay | Admitting: Neurology

## 2018-06-25 DIAGNOSIS — G40009 Localization-related (focal) (partial) idiopathic epilepsy and epileptic syndromes with seizures of localized onset, not intractable, without status epilepticus: Secondary | ICD-10-CM | POA: Diagnosis not present

## 2018-06-25 MED ORDER — LEVETIRACETAM ER 500 MG PO TB24: ORAL_TABLET | ORAL | 3 refills | Status: DC

## 2018-06-25 NOTE — Patient Instructions (Signed)
Great seeing you! Continue Keppra XR 500mg : Take 1 tablet in AM, 2 tablets in PM. Follow-up in 1 year, call for any changes.  Seizure Precautions: 1. If medication has been prescribed for you to prevent seizures, take it exactly as directed.  Do not stop taking the medicine without talking to your doctor first, even if you have not had a seizure in a long time.   2. Avoid activities in which a seizure would cause danger to yourself or to others.  Don't operate dangerous machinery, swim alone, or climb in high or dangerous places, such as on ladders, roofs, or girders.  Do not drive unless your doctor says you may.  3. If you have any warning that you may have a seizure, lay down in a safe place where you can't hurt yourself.    4.  No driving for 6 months from last seizure, [place].   Please refer to the following link on the [employer]'s website for more information: http://www.epilepsyfoundation.org/answerplace/Social/driving/drivingu.cfm   5.  Maintain good sleep hygiene. Avoid alcohol.  6.  Contact your doctor if you have any problems that may be related to the medicine you are taking.  7.  Call 911 and bring the patient back to the ED if:        A.  The seizure lasts longer than 5 minutes.       B.  The patient doesn't awaken shortly after the seizure  C.  The patient has new problems such as difficulty seeing, speaking or moving  D.  The patient was injured during the seizure  E.  The patient has a temperature over 102 F (39C)  F.  The patient vomited and now is having trouble breathing

## 2018-06-25 NOTE — Progress Notes (Signed)
NEUROLOGY FOLLOW UP OFFICE NOTE  Billy [name] [id number]  DOB: 1976-04-12  HISTORY OF PRESENT ILLNESS: I had the pleasure of seeing Billy [name] in follow-up in the neurology clinic on 06/25/2018.  The patient was last seen a year ago for recurrent seizures. He has a history of relatively stereotyped episodes concerning for simple partial seizures with intrusive thoughts, deja vu sensation. He had unwitnessed events in 2015 and March 2017 concerning for seizure waking up with urinary incontinence. Keppra dose increased, he was on Keppra XR 1000mg  daily when he called our office last 11/07/16 that he was having one seizure a day for the last few days right before it is time for his medications. He was under a lot of stress at that time. He reports they were less than a minute with no loss of awareness. Dose of Keppra increased to 500mg  in AM, 1000mg  in PM.   Since his last visit, he continues to report doing well with no seizures since April 2018. No side effects on Keppra. He denies any staring/unresponsive episodes, gaps in time, olfactory/gustatory hallucinations, focal numbness/tingling, myoclonic jerks. Occasionally his left leg and foot drags leading to minor falls, no injuries. He has occasional headaches that resolve with Advil, around 1-2 times a year he has bad headaches where he has to lay down. Sleep is good. He had a neck injury last month while working at [employer] and started having numbness above his left ear, this has resolved.   History on Initial Assessment 03/17/2015: This is a very pleasant 42 yo RH man with a history of severe scoliosis with congenital lower extremity musculoskeletal abnormalities, with a history suggestive of recurrent simple partial seizures. He reports that symptoms started in 2001 and have been fairly stereotyped since then. He would feel that "something just clicks," then he would have intrusive thoughts that he either dreamt of the the past, thoughts  he had suppressed and would start remembering, or things that had happened when he was a child. This would last for 30-45 seconds, then feels his heart rate increase, his whole body starts warming up and he breaks into a sweat, then has a sensation to urinate. People around him would not notice the episode, he denies any confusion or unresponsiveness. Most of the time he can stay focused, but other times he does not feel completely focused and has had to excuse himself it they happened at work. He recalls the first event occurred while [id number], he had not slept for more than 2 days, he recalls feeling a little confused driving in the dark, he could not see anything and this freaked him out. He had another again while driving and had to pull over, then vomited. He denies any further episodes of vomiting since then. Initially, he was having the episodes 1-2 times a year, but then increased in frequency to once a month, clustering for 2-3 days. He would have 1-2 episodes the first day, then 3-5 episodes the next day, tapering down to 1-2 episodes on day 3, then stopping for another 30 days. Last episode was 02/17/15. After the first 2 episodes, he can have a mild 4/10 pressure-like headache in the frontal regions, with good response to Advil. He cannot identify any clear triggers, he denies any sleep deprivation and rarely drinks alcohol. He had previously been evaluated at [facility] by Dr. Kathrynn Ducking in 2007. Per notes, he was seen by [facility] in 2003 and had an EEG reported as normal.  He was told he might have migraines and was started on Zonegran, which caused lethargy. At that time, symptoms were felt to be due to anxiety attacks and he was referred to [facility], however it appears he did not follow through on this. He reports that he is very laid-back and denies anxiety. He did not seek any medical attention for these stereotyped symptoms for 9 years, until January 2016 when he reported waking up with  tongue bite with blood on his pillow and urinary incontinence twice at the end of 2015. He was started on uptitration of Trileptal, currently on 600mg  BID. He denies any change in the frequency or duration of these events since starting medication. He feels that since starting Trileptal, he has been having more cognitive problems with difficulties multitasking, and being more forgetful. He has always been easily distractible, but recently has forgotten to save pictures he does at work, one time he left a medication on the table. He forgets to grab certain things when getting ready in the morning. He has also started having dizziness 45-60 minutes after taking Trileptal. He initially had tingling in both hands, which is not as bad but still occurring. He has noticed brief twitching in his extremities, which did not occur prior to starting Trileptal. He would be using the computer mouse then have a brief jerk, but more noticeable in his hands or arms when lying down.   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 His right leg has limited movement in the foot. There is no history of febrile convulsions, CNS infections such as meningitis/encephalitis, significant traumatic brain injury, or family history of seizures.  Prior AEDs: Zonisamide EEGs: normal in 2003 per report MRI: I personally reviewed MRI brain with and without contrast done 11/2014 which did not show any acute abnormalities, hippocampi symmetric with no abnormal signal or enhancement. There was mild prominence of the lateral ventricles, felt to be normal anatomic variation.  PAST MEDICAL HISTORY: Past Medical History:  Diagnosis Date  . Hypertension   . Scoliosis   . Seizures (HCC)     MEDICATIONS: Current Outpatient Medications on File Prior to Visit    Medication Sig Dispense Refill  . amLODipine (NORVASC) 5 MG tablet TAKE 1 TABLET BY MOUTH EVERY DAY    . levETIRAcetam (KEPPRA XR) 500 MG 24 hr tablet Take 1 tablet by mouth in the morning.  Take 2 tablets by mouth in the evening 270 tablet 3  . lisinopril (PRINIVIL,ZESTRIL) 10 MG tablet TAKE 1 TABLET BY MOUTH EVERY DAY OFFICE VISIT DUE NOW  0   No current facility-administered medications on file prior to visit.     ALLERGIES: No Known Allergies  FAMILY HISTORY: No family history on file.  SOCIAL HISTORY: Social History   Socioeconomic History  . Marital status: Married    Spouse name: Not on file  . Number of children: Not on file  . Years of education: Not on file  . Highest education level: Not on file  Occupational History  . 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 Financial resource strain: Not on file  . Food insecurity:    Worry: Not on file    Inability: Not on file  . Transportation needs:    Medical: Not on file    Non-medical: Not on file  Tobacco Use  .  Smoking status: Never Smoker  . Smokeless tobacco: Never Used  Substance and Sexual Activity  . Alcohol use: Yes    Alcohol/week: 0.0 standard drinks    Comment: Rare  . Drug use: No  . Sexual activity: Not on file  Lifestyle  . Physical activity:    Days per week: Not on file    Minutes per session: Not on file  . Stress: Not on file  Relationships  . Social connections:    Talks on phone: Not on file    Gets together: Not on file    Attends religious service: Not on file    Active member of club or organization: Not on file    Attends meetings of clubs or organizations: Not on file    Relationship status: Not on file  . Intimate partner violence:    Fear of current or ex partner: Not on file    Emotionally abused: Not on file    Physically abused: Not on file    Forced sexual activity: Not on file  Other Topics Concern  . Not on file  Social History Narrative  . Not on file    REVIEW OF  SYSTEMS: Constitutional: No fevers, chills, or sweats, no generalized fatigue, change in appetite Eyes: No visual changes, double vision, eye pain Ear, nose and throat: No hearing loss, ear pain, nasal congestion, sore throat Cardiovascular: No chest pain, palpitations Respiratory:  No shortness of breath at rest or with exertion, wheezes GastrointestinaI: No nausea, vomiting, diarrhea, abdominal pain, fecal incontinence Genitourinary:  No dysuria, urinary retention or frequency Musculoskeletal:  No neck pain, back pain Integumentary: No rash, pruritus, skin lesions Neurological: as above Psychiatric: No depression, insomnia, anxiety Endocrine: No palpitations, fatigue, diaphoresis, mood swings, change in appetite, change in weight, increased thirst Hematologic/Lymphatic:  No anemia, purpura, petechiae. Allergic/Immunologic: no itchy/runny eyes, nasal congestion, recent allergic reactions, rashes  PHYSICAL EXAM: Vitals:   06/25/18 1413  BP: 120/82  Pulse: 84  SpO2: 98%   General: No acute distress Head: Normocephalic/atraumatic Neck: supple, no paraspinal tenderness, full range of motion Back: No paraspinal tenderness Heart: regular rate and rhythm Lungs: Clear to auscultation bilaterally. Vascular: No carotid bruits. Skin/Extremities: No rash, no edema; severe atrophy in both LE, left > right calf (unchanged) Neurological Exam: Mental status: alert and oriented to person, place, and time, no dysarthria or aphasia, Fund of knowledge is appropriate. Recent and remote memory are intact. Attention and concentration are normal. Able to name objects and repeat phrases. Cranial nerves: CN I: not tested CN II: pupils equal, round and reactive to light, visual fields intact CN III, IV, VI: full range of motion, no nystagmus, no ptosis CN V: facial sensation intact CN VII: upper and lower face symmetric CN VIII: hearing intact to finger rub CN IX, X: gag intact, uvula midline CN  XI: sternocleidomastoid and trapezius muscles intact CN XII: tongue midline Bulk & Tone: normal, no fasciculations. Motor: 5/5 on both UE, hip flexors 4/5 bilaterally, knee flexors/extensors, hip adduction/abduction 5/5, 0/5 left foot dorsiflexion and plantarflexion, 2/5 right foot dorsi/plantarflexion (unchanged) Sensation: intact to light touch. Romberg test negative Deep Tendon Reflexes: +2 on both UE, bilateral patella, absent ankle jerks bilaterally (similar to prior) Cerebellar: no incoordination on finger to nose testing Gait: wide-based, steppage gait favoring the left leg (unchanged) Tremor: none  IMPRESSION: This is a pleasant 42 yo RH man with a history of severe scoliosis with bilateral leg deformities, with a history of relatively stereotyped episodes concerning  for simple partial seizures. At the end of 2015, he woke up with tongue bite and incontinence twice, concerning for possible unwitnessed convulsions. The stereotyped episodes stopped once he started Keppra XR. He continued on low dose 500mg  daily until he woke up with urinary incontinence at the beginning of March 2017, and increased dose to 1000mg  qhs. Dose again increased to 500mg  in AM, 1000mg  in PM after a few small seizures in April 2018. No side effects on Keppra. He has been doing well since then, refills for Keppra sent. He is aware of [place] driving laws to stop driving after an episode of loss of awareness/consciousness, until 6 months event-free. He will follow-up in 1 year and knows to call for any changes.   Thank you for allowing me to participate in his care.  Please do not hesitate to call for any questions or concerns.  The duration of this appointment visit was 15 minutes of face-to-face time with the patient.  Greater than 50% of this time was spent in counseling, explanation of diagnosis, planning of further management, and coordination of care.   Patrcia Dolly, M.D.    CC: [facility] at  [facility]

## 2018-08-28 ENCOUNTER — Other Ambulatory Visit: Payer: Self-pay | Admitting: Neurology

## 2018-08-28 DIAGNOSIS — G40009 Localization-related (focal) (partial) idiopathic epilepsy and epileptic syndromes with seizures of localized onset, not intractable, without status epilepticus: Secondary | ICD-10-CM

## 2018-09-06 ENCOUNTER — Other Ambulatory Visit: Payer: Self-pay | Admitting: Neurology

## 2018-09-06 DIAGNOSIS — G40009 Localization-related (focal) (partial) idiopathic epilepsy and epileptic syndromes with seizures of localized onset, not intractable, without status epilepticus: Secondary | ICD-10-CM

## 2018-09-07 ENCOUNTER — Telehealth: Payer: Self-pay | Admitting: Neurology

## 2018-09-07 NOTE — Telephone Encounter (Signed)
This was already sent this morning

## 2018-09-07 NOTE — Telephone Encounter (Signed)
Left vm about needing generic keppra medication refill. Please send. Thanks!

## 2019-06-02 ENCOUNTER — Other Ambulatory Visit: Payer: Self-pay | Admitting: Neurology

## 2019-06-02 DIAGNOSIS — G40009 Localization-related (focal) (partial) idiopathic epilepsy and epileptic syndromes with seizures of localized onset, not intractable, without status epilepticus: Secondary | ICD-10-CM

## 2019-06-19 ENCOUNTER — Ambulatory Visit: Payer: 59 | Admitting: Neurology

## 2019-06-28 ENCOUNTER — Ambulatory Visit: Payer: 59 | Admitting: Neurology

## 2019-11-10 ENCOUNTER — Other Ambulatory Visit: Payer: Self-pay | Admitting: Neurology

## 2019-11-10 DIAGNOSIS — G40009 Localization-related (focal) (partial) idiopathic epilepsy and epileptic syndromes with seizures of localized onset, not intractable, without status epilepticus: Secondary | ICD-10-CM

## 2019-12-11 ENCOUNTER — Other Ambulatory Visit: Payer: Self-pay | Admitting: Neurology

## 2019-12-11 DIAGNOSIS — G40009 Localization-related (focal) (partial) idiopathic epilepsy and epileptic syndromes with seizures of localized onset, not intractable, without status epilepticus: Secondary | ICD-10-CM

## 2019-12-18 ENCOUNTER — Other Ambulatory Visit: Payer: Self-pay | Admitting: Neurology

## 2019-12-18 DIAGNOSIS — G40009 Localization-related (focal) (partial) idiopathic epilepsy and epileptic syndromes with seizures of localized onset, not intractable, without status epilepticus: Secondary | ICD-10-CM

## 2019-12-19 ENCOUNTER — Other Ambulatory Visit: Payer: Self-pay | Admitting: Neurology

## 2019-12-19 ENCOUNTER — Telehealth: Payer: Self-pay | Admitting: Neurology

## 2019-12-19 DIAGNOSIS — G40009 Localization-related (focal) (partial) idiopathic epilepsy and epileptic syndromes with seizures of localized onset, not intractable, without status epilepticus: Secondary | ICD-10-CM

## 2019-12-19 MED ORDER — LEVETIRACETAM ER 500 MG PO TB24: ORAL_TABLET | ORAL | 7 refills | Status: DC

## 2019-12-19 NOTE — Telephone Encounter (Signed)
Can we refill this until Dec ?

## 2019-12-19 NOTE — Telephone Encounter (Signed)
Patient called in after trying to get his medication filled at the pharmacy and them only giving him half. He didn't realize he didn't have an appointment. I scheduled him today for December 20th. The patient would like a call back about his prescription.

## 2019-12-19 NOTE — Telephone Encounter (Signed)
Patient called and left a message requesting a call back about the refills for his seizure medication. He said he only has one dose left.

## 2019-12-19 NOTE — Telephone Encounter (Signed)
Pls let him  know Rx sent to pharmacy on file, thanks

## 2020-07-20 ENCOUNTER — Encounter: Payer: Self-pay | Admitting: Neurology

## 2020-07-20 ENCOUNTER — Ambulatory Visit: Payer: Self-pay | Admitting: Neurology

## 2020-07-20 DIAGNOSIS — Z029 Encounter for administrative examinations, unspecified: Secondary | ICD-10-CM

## 2020-07-23 ENCOUNTER — Other Ambulatory Visit: Payer: Self-pay | Admitting: Neurology

## 2020-07-23 DIAGNOSIS — G40009 Localization-related (focal) (partial) idiopathic epilepsy and epileptic syndromes with seizures of localized onset, not intractable, without status epilepticus: Secondary | ICD-10-CM

## 2020-07-27 ENCOUNTER — Encounter: Payer: Self-pay | Admitting: Neurology

## 2020-08-12 ENCOUNTER — Other Ambulatory Visit: Payer: Self-pay | Admitting: Neurology

## 2020-08-12 DIAGNOSIS — G40009 Localization-related (focal) (partial) idiopathic epilepsy and epileptic syndromes with seizures of localized onset, not intractable, without status epilepticus: Secondary | ICD-10-CM

## 2020-08-12 NOTE — Telephone Encounter (Signed)
Pls let him know he needs to schedule f/u for further refills, pls let me know when appt is and I will send refills until then, thanks!

## 2020-08-12 NOTE — Telephone Encounter (Signed)
Spoke with patient and was able to sch a f/u for 04/26/21.

## 2021-04-26 ENCOUNTER — Ambulatory Visit: Payer: BC Managed Care – PPO | Admitting: Neurology

## 2021-05-15 ENCOUNTER — Other Ambulatory Visit: Payer: Self-pay | Admitting: Neurology

## 2021-05-15 DIAGNOSIS — G40009 Localization-related (focal) (partial) idiopathic epilepsy and epileptic syndromes with seizures of localized onset, not intractable, without status epilepticus: Secondary | ICD-10-CM

## 2021-05-21 ENCOUNTER — Other Ambulatory Visit: Payer: Self-pay | Admitting: Neurology

## 2021-05-21 DIAGNOSIS — G40009 Localization-related (focal) (partial) idiopathic epilepsy and epileptic syndromes with seizures of localized onset, not intractable, without status epilepticus: Secondary | ICD-10-CM

## 2021-06-01 ENCOUNTER — Encounter: Payer: Self-pay | Admitting: Neurology

## 2021-06-01 ENCOUNTER — Ambulatory Visit: Payer: BC Managed Care – PPO | Admitting: Neurology

## 2021-06-01 ENCOUNTER — Other Ambulatory Visit: Payer: Self-pay

## 2021-06-01 VITALS — BP 134/92 | HR 84 | Ht 60.0 in | Wt 134.6 lb

## 2021-06-01 DIAGNOSIS — G40009 Localization-related (focal) (partial) idiopathic epilepsy and epileptic syndromes with seizures of localized onset, not intractable, without status epilepticus: Secondary | ICD-10-CM

## 2021-06-01 MED ORDER — LEVETIRACETAM ER 500 MG PO TB24
ORAL_TABLET | ORAL | 3 refills | Status: DC
Start: 2021-06-01 — End: 2022-09-01

## 2021-06-01 NOTE — Progress Notes (Signed)
NEUROLOGY FOLLOW UP OFFICE NOTE  Billy [name] [id number] Mar 13, 1976  HISTORY OF PRESENT ILLNESS: I had the pleasure of seeing Surafel Hilleary in follow-up in the neurology clinic on 06/01/2021.  The patient was last seen 3 years ago for seizures. He has a history of relatively stereotyped episodes concerning for simple partial seizures with intrusive thoughts, deja vu sensation. He had unwitnessed events in 2015 and March 2017 concerning for seizure waking up with urinary incontinence. Keppra dose increased, he was on Keppra XR 1000mg  daily when he called our office last 11/07/16 that he was having one seizure a day for the last few days right before it is time for his medications. He was under a lot of stress at that time. He reports they were less than a minute with no loss of awareness. Dose of Keppra increased to 500mg  in AM, 1000mg  in PM. He denies any major seizures since 10/2016. He has had infrequent intrusive thoughts/deja vu lasting 10 seconds with no associated confusion or loss of awareness, occurring 2-3 times a year. He denies any staring/unresponsive episodes, no new focal symptoms. He has headaches that can last several days, he had a nagging headache over the weekend. He has been staying away from NSAIDs and takes Tylenol but it is not as helpful as Advil. He denies any dizziness. He gets around 6 hours of sleep. He works as a . He has noticed his tolerance is lower and he gets aggravated a lot quicker now. Memory is "always horrible," he denies missing medications but last night could not remember if he already took his dose.   History on Initial Assessment 03/17/2015: This is a very pleasant 45 yo RH man with a history of severe scoliosis with congenital lower extremity musculoskeletal abnormalities, with a history suggestive of recurrent simple partial seizures. He reports that symptoms started in 2001 and have been fairly stereotyped since then. He would feel that  "something just clicks," then he would have intrusive thoughts that he either dreamt of the the past, thoughts he had suppressed and would start remembering, or things that had happened when he was a child. This would last for 30-45 seconds, then feels his heart rate increase, his whole body starts warming up and he breaks into a sweat, then has a sensation to urinate. People around him would not notice the episode, he denies any confusion or unresponsiveness. Most of the time he can stay focused, but other times he does not feel completely focused and has had to excuse himself it they happened at work. He recalls the first event occurred while [place], he had not slept for more than 2 days, he recalls feeling a little confused driving in the dark, he could not see anything and this freaked him out. He had another again while driving and had to pull over, then vomited. He denies any further episodes of vomiting since then. Initially, he was having the episodes 1-2 times a year, but then increased in frequency to once a month, clustering for 2-3 days. He would have 1-2 episodes the first day, then 3-5 episodes the next day, tapering down to 1-2 episodes on day 3, then stopping for another 30 days. Last episode was 02/17/15. After the first 2 episodes, he can have a mild 4/10 pressure-like headache in the frontal regions, with good response to Advil. He cannot identify any clear triggers, he denies any sleep deprivation and rarely drinks alcohol. He had previously been evaluated at  [facility] by Dr. Kathrynn Ducking in 2007. Per notes, he was seen by [facility] in 2003 and had an EEG reported as normal. He was told he might have migraines and was started on Zonegran, which caused lethargy. At that time, symptoms were felt to be due to anxiety attacks and he was referred to [facility], however it appears he did not follow through on this. He reports that he is very laid-back and denies anxiety. He did not seek any  medical attention for these stereotyped symptoms for 9 years, until January 2016 when he reported waking up with tongue bite with blood on his pillow and urinary incontinence twice at the end of 2015. He was started on uptitration of Trileptal, currently on 600mg  BID. He denies any change in the frequency or duration of these events since starting medication. He feels that since starting Trileptal, he has been having more cognitive problems with difficulties multitasking, and being more forgetful. He has always been easily distractible, but recently has forgotten to save pictures he does at work, one time he left a medication on the table. He forgets to grab certain things when getting ready in the morning. He has also started having dizziness 45-60 minutes after taking Trileptal. He initially had tingling in both hands, which is not as bad but still occurring. He has noticed brief twitching in his extremities, which did not occur prior to starting Trileptal. He would be using the computer mouse then have a brief jerk, but more noticeable in his hands or arms when lying down.   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 His right leg has limited movement in the foot. There is no history of febrile convulsions, CNS infections such as meningitis/encephalitis, significant traumatic brain injury, or family history of seizures.  Prior AEDs: Zonisamide, Trileptal EEGs: normal in 2003 per report MRI: I personally reviewed MRI brain with and without contrast done 11/2014 which did not show any acute abnormalities, hippocampi symmetric with no abnormal signal or enhancement. There was mild prominence of the lateral ventricles, felt to be normal anatomic variation.  PAST MEDICAL HISTORY: Past Medical History:  Diagnosis Date    Hypertension    Scoliosis    Seizures (HCC)     MEDICATIONS: Current Outpatient Medications on File Prior to Visit  Medication Sig Dispense Refill   amLODipine (NORVASC) 5 MG tablet TAKE 1 TABLET BY MOUTH EVERY DAY     hydrochlorothiazide (MICROZIDE) 12.5 MG capsule Take by mouth daily.  1   levETIRAcetam (KEPPRA XR) 500 MG 24 hr tablet TAKE 1 TABLET EVERY MORNING AND 2 TABLETS EVERY NIGHT 270 tablet 1   No current facility-administered medications on file prior to visit.    ALLERGIES: No Known Allergies  FAMILY HISTORY: History reviewed. No pertinent family history.  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  Tobacco Use   Smoking status: Never   Smokeless tobacco: Never  Vaping Use   Vaping Use: Never used  Substance and Sexual Activity   Alcohol use: Yes    Alcohol/week: 0.0 standard drinks  Comment: Rare   Drug use: No   Sexual activity: Not on file  Other Topics Concern   Not on file  Social History Narrative   Right handed    Social Determinants of Health   Financial Resource Strain: Not on file  Food Insecurity: Not on file  Transportation Needs: Not on file  Physical Activity: Not on file  Stress: Not on file  Social Connections: Not on file  Intimate Partner Violence: Not on file     PHYSICAL EXAM: Vitals:   06/01/21 1606  BP: (!) 134/92  Pulse: 84  SpO2: 94%   General: No acute distress Head:  Normocephalic/atraumatic Skin/Extremities: No rash, no edema Neurological Exam: alert and oriented to person, place, and time. No aphasia or dysarthria. Fund of knowledge is appropriate.  Recent and remote memory are intact.  Attention and concentration are normal.   Cranial nerves: Pupils equal, round. Extraocular movements intact  with no nystagmus. Visual fields full.  No facial asymmetry.  Motor: Bulk and tone normal, muscle strength 5/5 proximally, 0/5 left foot dorsiflexion, 2/5 right foot dorsiflexion. Finger to nose testing intact.  Gait wide-based, steppage gait with right leg flexed (unchanged)  IMPRESSION: This is a pleasant 45 yo RH man with a history of severe scoliosis with bilateral leg deformities, with a history of relatively stereotyped episodes concerning for simple partial seizures. He has had infrequent episodes of waking up with tongue bite and urinary incontinence. He denies any major seizures since 2018, on Levetiracetam ER 500mg  in AM, 1000mg  in PM. He has infrequent auras. He notes some irritability on Levetiracetam and will try B6 supplements (50-100mg ). He was advised to start using a pillbox. He is aware of [place] driving laws to stop driving after a seizure until 6 months seizure-free. Follow-up in 1 year, call for any changes.   Thank you for allowing me to participate in his care.  Please do not hesitate to call for any questions or concerns.    , M.D.   CC: [facility] at [facility]

## 2021-06-01 NOTE — Patient Instructions (Signed)
Good to see you again! Continue Keppra XR 500mg : Take 1 tablet in AM, 2 tablets in PM. Follow-up in 1 year, call for any changes   Seizure Precautions: 1. If medication has been prescribed for you to prevent seizures, take it exactly as directed.  Do not stop taking the medicine without talking to your doctor first, even if you have not had a seizure in a long time.   2. Avoid activities in which a seizure would cause danger to yourself or to others.  Don't operate dangerous machinery, swim alone, or climb in high or dangerous places, such as on ladders, roofs, or girders.  Do not drive unless your doctor says you may.  3. If you have any warning that you may have a seizure, lay down in a safe place where you can't hurt yourself.    4.  No driving for 6 months from last seizure, as per [facility].   Please refer to the following link on the Epile[employer] Foundation of America's website for more information: http://www.epilepsyfoundation.org/answerplace/Social/driving/drivingu.cfm   5.  Maintain good sleep hygiene. Avoid alcohol.  6.  Contact your doctor if you have any problems that may be related to the medicine you are taking.  7.  Call 911 and bring the patient back to the ED if:        A.  The seizure lasts longer than 5 minutes.       B.  The patient doesn't awaken shortly after the seizure  C.  The patient has new problems such as difficulty seeing, speaking or moving  D.  The patient was injured during the seizure  E.  The patient has a temperature over 102 F (39C)  F.  The patient vomited and now is having trouble breathing

## 2021-11-15 ENCOUNTER — Ambulatory Visit: Payer: BC Managed Care – PPO | Admitting: Neurology

## 2022-01-06 ENCOUNTER — Ambulatory Visit (HOSPITAL_BASED_OUTPATIENT_CLINIC_OR_DEPARTMENT_OTHER): Payer: BC Managed Care – PPO | Admitting: Orthopaedic Surgery

## 2022-01-07 ENCOUNTER — Ambulatory Visit (HOSPITAL_BASED_OUTPATIENT_CLINIC_OR_DEPARTMENT_OTHER): Payer: BC Managed Care – PPO | Admitting: Orthopaedic Surgery

## 2022-01-07 ENCOUNTER — Ambulatory Visit (INDEPENDENT_AMBULATORY_CARE_PROVIDER_SITE_OTHER): Payer: BC Managed Care – PPO

## 2022-01-07 ENCOUNTER — Ambulatory Visit (HOSPITAL_BASED_OUTPATIENT_CLINIC_OR_DEPARTMENT_OTHER): Payer: Self-pay | Admitting: Orthopaedic Surgery

## 2022-01-07 DIAGNOSIS — M7711 Lateral epicondylitis, right elbow: Secondary | ICD-10-CM

## 2022-01-07 DIAGNOSIS — M25521 Pain in right elbow: Secondary | ICD-10-CM

## 2022-01-07 MED ORDER — OXYCODONE HCL 5 MG PO CAPS: 5.0000 mg | ORAL_CAPSULE | ORAL | 0 refills | Status: DC | PRN

## 2022-01-07 MED ORDER — IBUPROFEN 800 MG PO TABS: 800.0000 mg | ORAL_TABLET | Freq: Three times a day (TID) | ORAL | 0 refills | Status: AC

## 2022-01-07 MED ORDER — ACETAMINOPHEN 500 MG PO TABS: 500.0000 mg | ORAL_TABLET | Freq: Three times a day (TID) | ORAL | 0 refills | Status: AC

## 2022-01-07 NOTE — Progress Notes (Signed)
Chief Complaint: Right elbow pain     History of Present Illness:    Billy [name] is a 46 y.o. male right-hand-dominant male presents with ongoing right tennis elbow that has been going on for approximately 15 years now.  He states that initially his first episode resolved with changing his workout schedule.  Since that time he has had on and off lateral elbow pain that has been seen by multiple providers.  He has undergone multiple injections the last of which was in 2022.  This gave him approximately 2 weeks of relief.  He has been participating in a stretching program for the elbow without any relief.  More recently later this last year he did undergo 2 rounds of PRP into the lateral epicondyle with only approximately 2 months of relief.  This eventually returned and was significantly swollen and worse than the initial pain.  He continues to be active and would like to be able to work out although any type of gripping type activity significantly aggravates the lateral elbow.  His stretching program is no longer been effective in terms of relieving his pain.  He has been taking Advil which helps although he does not want to take this persistently.  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  He does have pain during most activities of his job.    Surgical History:   None  PMH/PSH/Family History/Social History/Meds/Allergies:    Past Medical History:  Diagnosis Date   Hypertension    Scoliosis    Seizures (HCC)    Past Surgical History:  Procedure Laterality Date   BLADDER SURGERY     FOOT SURGERY     left   LEG SURGERY     left calf   LUMBAR SPINE SURGERY     x 2   OPEN ANTERIOR SHOULDER RECONSTRUCTION     Left   Social History   Socioeconomic History   Marital status: Married    Spouse name: Not on file   Number of children: Not on file   Years of education: Not on file   Highest education level: Not on file   Occupational History   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 No Known Allergies Current Outpatient Medications  Medication Sig Dispense Refill   amLODipine (NORVASC) 5 MG tablet TAKE 1 TABLET BY MOUTH EVERY DAY     hydrochlorothiazide (MICROZIDE) 12.5 MG capsule Take by mouth daily.  1   levETIRAcetam (KEPPRA XR) 500 MG 24 hr tablet Take 1 tablet every morning and 2 tablets every night 270 tablet 3  No current facility-administered medications for this visit.   No results found.  Review of Systems:   A ROS was performed including pertinent positives and negatives as documented in the HPI.  Physical Exam :   Constitutional: NAD and appears stated age Neurological: Alert and oriented Psych: Appropriate affect and cooperative There were no vitals taken for this visit.   Comprehensive Musculoskeletal Exam:    Inspection Right Left  Skin No atrophy or gross abnormalities appreciated No atrophy or gross abnormalities appreciated  Palpation    Tenderness Lateral epicondyle None  Crepitus None None  Range of Motion    Flexion (passive) 160 160  Flexion (active) 160 160  Extension 0 0  Pronation normal normal  Supination normal normal   Strength    Flexion  5/5 5/5  Extension 5/5 5/5  Pronation  5/5 5/5  Supination  5/5 5/5  Special Tests    Lateral epicondyle pain with resisted wrist  extension Positive Negative  Medial epicondyle pain with resisted wrist flexion  Negative Negative  Tinel test over cubital tunnel  Negative  Negative   Instability    Generalized Laxity No No  Varus stress No instability No instability  Valgus stress  No instability No instability  Reflexes    Triceps Normal (2/4) Normal (2/4)  Brachioradialis Normal (2/4) Normal (2/4)  Biceps Normal (2/4) Normal (2/4)  Neurologic    Fires PIN, radial, median, ulnar, musculocutaneous, axillary, suprascapular, long thoracic, and spinal accessory innervated muscles. No abnormal sensibility.  Vascular/Lymphatic    Radial Pulse 2+ 2+  Cervical Exam    Patient has symmetric cervical range of motion with Negative Spurling's test.     Imaging:   Xray (4 views right elbow): There is some enlargement of the lateral epicondyle although no significant heterotopic ossification   I personally reviewed and interpreted the radiographs.   Assessment:   46 y.o. male right-hand-dominant male who presents with right elbow lateral epicondylitis that has been going on and off for multiple years.  At this time he is no longer getting any type of relief from injections with steroid and most recently PRP.  His stretching program has not been effective in terms of relieving his symptoms.  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  Given the fact that he has failed multiple forms of treatment and lateral epicondyle debridement and repair.  I did discuss the specific recovery associated with this.  We did describe additional alternatives such as dry needling or iontophoresis for the right elbow and this would require additional physical therapy.  Given the fact that he is spent many years on both diagnostics and different treatment options he states that he is more hopeful and pursuing a more definitive option.  Side effects he is elected for right lateral epicondyle debridement and  repair  Plan :    -Plan for right elbow lateral epicondyle debridement and repair   After a lengthy discussion of treatment options, including risks, benefits, alternatives, complications of surgical and nonsurgical conservative options, the patient elected surgical repair.   The patient  is aware of the material risks  and complications including, but not limited to injury to adjacent structures, neurovascular injury, infection, numbness, bleeding, implant failure, thermal burns, stiffness, persistent pain, failure to heal, disease transmission from allograft, need for further surgery, dislocation, anesthetic risks, blood clots, risks of death,and others. The probabilities of surgical success and failure discussed with patient given their particular  co-morbidities.The time and nature of expected rehabilitation and recovery was discussed.The patient's questions were all answered preoperatively.  No barriers to understanding were noted. I explained the natural history of the disease process and Rx rationale.  I explained to the patient what I considered to be reasonable expectations given their personal situation.  The final treatment plan was arrived at through a shared patient decision making process model.     I personally saw and evaluated the patient, and participated in the management and treatment plan.  Huel Cote, MD Attending Physician, Orthopedic Surgery  This document was dictated using Dragon voice recognition software. A reasonable attempt at proof reading has been made to minimize errors.

## 2022-02-08 ENCOUNTER — Ambulatory Visit (HOSPITAL_BASED_OUTPATIENT_CLINIC_OR_DEPARTMENT_OTHER): Payer: Self-pay | Admitting: Orthopaedic Surgery

## 2022-02-08 ENCOUNTER — Other Ambulatory Visit (HOSPITAL_BASED_OUTPATIENT_CLINIC_OR_DEPARTMENT_OTHER): Payer: Self-pay | Admitting: Orthopaedic Surgery

## 2022-02-08 DIAGNOSIS — M7711 Lateral epicondylitis, right elbow: Secondary | ICD-10-CM

## 2022-02-09 ENCOUNTER — Encounter (HOSPITAL_COMMUNITY): Payer: Self-pay | Admitting: Vascular Surgery

## 2022-02-09 NOTE — Progress Notes (Signed)
Anesthesia Chart Review:  Case: [id number] Date/Time: 02/10/22 1145   Procedures:      TENNIS ELBOW RELEASE/NIRSCHEL PROCEDURE [1145] (Right: Elbow)     LIGAMENT REPAIR (Right)   Anesthesia type: Choice   Pre-op diagnosis: RIGHT LATERAL EPICONDYLITIS   [facility]: [facility] / [facility]   Surgeons: Huel Cote, MD       DISCUSSION: Billy [name] is a 46 year old male scheduled for the above procedure.  History includes never smoker, HTN, scoliosis, seizures, spinal surgery.  Billy [name] called to reported sick symptoms since 02/03/22. Started with dry cough and sore throat. Now with persistent productive cough and nasal congestion. No muscle aches or fever, but feels he has lower energy at the end of the day. He noted a "froggy" voice this morning and coughed up a bit of secretions. Tends to be better towards the end of the day but still having coughing spells throughout the day. Having to blow his nose several times a day as well. No obvious wheezing. Reports chronic DOE which is not worse. Activity is not limited by his DOE (able to walk up 1-2 flights of stairs). He has not taken a COVID-19 test.   Case is elective, and he has persistent URI symptoms with active coughing. He is not aware of any wheezing. No audible wheezing or conversational dyspnea during our phone conversation. No known smoking, asthma, or COPD history. Would recommend delaying surgery for at least several weeks--longer if still not recovered from symptoms. Also discussed that if symptoms persist then advised to seek medical evaluation. He said he would notify Billy [name] staff.   [facility], [facility] is neurologist   Past Medical History:  Diagnosis Date   Hypertension    Scoliosis    Seizures [facility])     Past Surgical History:  Procedure Laterality Date   BLADDER SURGERY     FOOT SURGERY     left   LEG SURGERY     left calf   LUMBAR SPINE SURGERY     x 2    OPEN ANTERIOR SHOULDER RECONSTRUCTION     Left    MEDICATIONS: No current facility-administered medications for this encounter.    amLODipine (NORVASC) 5 MG tablet   hydrochlorothiazide (MICROZIDE) 12.5 MG capsule   levETIRAcetam (KEPPRA XR) 500 MG 24 hr tablet   ibuprofen (ADVIL) 800 MG tablet   oxycodone (OXY-IR) 5 MG capsule    Shonna Chock, PA-C Surgical Short Stay/Anesthesiology [facility] Phone [phone] [facility] Phone [phone] 02/09/2022 12:10 PM

## 2022-02-10 ENCOUNTER — Ambulatory Visit (HOSPITAL_COMMUNITY)
Admission: RE | Admit: 2022-02-10 | Payer: BC Managed Care – PPO | Source: Ambulatory Visit | Admitting: Orthopaedic Surgery

## 2022-02-10 SURGERY — TENNIS ELBOW RELEASE/NIRSCHEL PROCEDURE
Anesthesia: Choice | Site: Elbow | Laterality: Right

## 2022-02-17 ENCOUNTER — Other Ambulatory Visit: Payer: Self-pay

## 2022-02-17 ENCOUNTER — Encounter (HOSPITAL_BASED_OUTPATIENT_CLINIC_OR_DEPARTMENT_OTHER): Payer: Self-pay | Admitting: Orthopaedic Surgery

## 2022-02-17 NOTE — Progress Notes (Signed)
I've called and left a voicemail with Dr. Serena Croissant that we have not been able to get in contact with pt and he may need to be canceled.

## 2022-02-20 ENCOUNTER — Encounter (HOSPITAL_COMMUNITY): Payer: Self-pay | Admitting: Anesthesiology

## 2022-02-20 NOTE — Anesthesia Preprocedure Evaluation (Deleted)
Anesthesia Evaluation    Reviewed: Allergy & Precautions, Patient's Chart, lab work & pertinent test results  History of Anesthesia Complications Negative for: history of anesthetic complications  Airway        Dental   Pulmonary neg pulmonary ROS,           Cardiovascular hypertension, Pt. on medications      Neuro/Psych Seizures -, Well Controlled,  negative psych ROS   GI/Hepatic negative GI ROS, Neg liver ROS,   Endo/Other  negative endocrine ROS  Renal/GU negative Renal ROS  negative genitourinary   Musculoskeletal RIGHT LATERAL EPICONDYLITIS   Abdominal   Peds  Hematology negative hematology ROS (+)   Anesthesia Other Findings Day of surgery medications reviewed with patient.  Reproductive/Obstetrics negative OB ROS                             Anesthesia Physical Anesthesia Plan  ASA: 2  Anesthesia Plan:    Post-op Pain Management: Tylenol PO (pre-op)*   Induction:   PONV Risk Score and Plan: Treatment may vary due to age or medical condition and Ondansetron  Airway Management Planned:   Additional Equipment: None  Intra-op Plan:   Post-operative Plan:   Informed Consent:   Plan Discussed with:   Anesthesia Plan Comments:         Anesthesia Quick Evaluation

## 2022-02-21 ENCOUNTER — Encounter (HOSPITAL_BASED_OUTPATIENT_CLINIC_OR_DEPARTMENT_OTHER)
Admission: RE | Admit: 2022-02-21 | Discharge: 2022-02-21 | Disposition: A | Discharge status: Home / Self Care | Payer: BC Managed Care – PPO | Source: Ambulatory Visit | Attending: Orthopaedic Surgery | Admitting: Orthopaedic Surgery

## 2022-02-21 DIAGNOSIS — I10 Essential (primary) hypertension: Secondary | ICD-10-CM | POA: Insufficient documentation

## 2022-02-21 DIAGNOSIS — Z79899 Other long term (current) drug therapy: Secondary | ICD-10-CM | POA: Insufficient documentation

## 2022-02-21 DIAGNOSIS — Z01818 Encounter for other preprocedural examination: Secondary | ICD-10-CM | POA: Diagnosis present

## 2022-02-21 LAB — BASIC METABOLIC PANEL
Anion gap: 12 (ref 5–15)
BUN: 13 mg/dL (ref 6–20)
CO2: 23 mmol/L (ref 22–32)
Calcium: 8.8 mg/dL — ABNORMAL LOW (ref 8.9–10.3)
Chloride: 103 mmol/L (ref 98–111)
Creatinine, Ser: 1.06 mg/dL (ref 0.61–1.24)
GFR, Estimated: >60 mL/min (ref >60)
Glucose, Bld: 81 mg/dL (ref 70–99)
Potassium: 4.3 mmol/L (ref 3.5–5.1)
Sodium: 138 mmol/L (ref 135–145)

## 2022-02-21 NOTE — Progress Notes (Incomplete)

## 2022-02-22 ENCOUNTER — Ambulatory Visit (HOSPITAL_BASED_OUTPATIENT_CLINIC_OR_DEPARTMENT_OTHER)
Admission: RE | Admit: 2022-02-22 | Payer: BC Managed Care – PPO | Source: Ambulatory Visit | Admitting: Orthopaedic Surgery

## 2022-02-22 DIAGNOSIS — I10 Essential (primary) hypertension: Secondary | ICD-10-CM

## 2022-02-22 DIAGNOSIS — Z79899 Other long term (current) drug therapy: Secondary | ICD-10-CM

## 2022-02-22 DIAGNOSIS — Z01818 Encounter for other preprocedural examination: Secondary | ICD-10-CM

## 2022-02-22 SURGERY — TENNIS ELBOW RELEASE/NIRSCHEL PROCEDURE
Anesthesia: Choice | Site: Elbow | Laterality: Right

## 2022-02-24 ENCOUNTER — Encounter (HOSPITAL_BASED_OUTPATIENT_CLINIC_OR_DEPARTMENT_OTHER): Payer: BC Managed Care – PPO | Admitting: Orthopaedic Surgery

## 2022-03-07 ENCOUNTER — Encounter (HOSPITAL_BASED_OUTPATIENT_CLINIC_OR_DEPARTMENT_OTHER): Payer: BC Managed Care – PPO | Admitting: Orthopaedic Surgery

## 2022-04-28 ENCOUNTER — Ambulatory Visit: Payer: BC Managed Care – PPO | Admitting: Psychology

## 2022-04-28 DIAGNOSIS — F331 Major depressive disorder, recurrent, moderate: Secondary | ICD-10-CM

## 2022-04-28 NOTE — Progress Notes (Signed)
[name] Initial Adult Intake  Name: Billy Billy [name] Date: 04/28/2022 MRN: [id number] DOB: March 30, 1976 PCP: Billy Billy [name], [place] At  Time spent: 8:00 - 8:55 AM  Guardian/Payee:  Self   Paperwork requested: Yes   Reason for Visit Billy Billy [name] Problem: Billy Billy [name] is a 46 year old Billy Billy [name]   who was referred by his PCP. Billy Billy [name]   who was referred by his PCP.   He reports that there was an incident at work with a difficult fellow employee.  This led to d/ between the doctor and management but there were no repercussions.  Coworkers have noted that he doesn't seem as happy as he used to be.  Billy Billy [name].  He noticed that he has been struggling for a few years.  He lost his 17 year old dog Billy Billy [name] ([place] unexpectedly about a year and half ago.  Billy Billy [name] also lost his older dog Billy Billy [name] [phone]) this year.  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    Billy Billy [name] broke up about year ago and it was a difficult relationship.  He doesn't use the word love very lightly and has only really loved one Billy [name].  He was slow to proclaim his feelings for this woman and the relationship ended.  She is an amazing woman but difficult due to longstanding mental health issues.  She once showed up at his work because she was suspicious that he was having an affair.  They continue to be friends.  He seems to take on people who need his help.  She apparently stopped taking her medication.  He tried on a number of occasions to get her the care she needed but eventually he had to give up (he "clicked off").  Growing up he always wanted to have a relationship but the bar he set was always really high.  And it's been difficult to be accepted with his "condition."  Billy Billy [name].  He has scoliosis (genetic) and it has caused issues with his bladder, his knees, legs and surgeries in his spine.  He has struggled with people of his life time who  are always trying to help him and this bothers him.    Mental Status Exam: Appearance:   Casual and Fairly Groomed     Behavior:  Appropriate  Motor:  Normal  Speech/Language:   NA  Affect:  Appropriate  Mood:  normal  Thought process:  normal  Thought content:    WNL  Sensory/Perceptual disturbances:    WNL  Orientation:  oriented to Billy [name], place, time/date, and situation  Attention:  Fair  Concentration:  Fair  Memory:  Recent;   [place] of knowledge:   Good  Insight:    Good  Judgment:   Good  Impulse Control:  Good    Reported Symptoms:   PHQ-9 = 11 (Moderate Depression) Pt endorses anhedonia, feeling down, disrupted sleep, fatigue, loss of concentration and felling bad about himself.  GAD-7 = 8 (Mild Anxiety)  Pt endorses frequently feeling nervous, not being able to control worries, worrying about different things and feeling irritible.    Risk Assessment: Danger to Self:  No Self-injurious Behavior: No Danger to Others: No Duty to Warn:no Physical Aggression / Violence:No  Access to Firearms a concern: No  Substance Abuse History: Current substance abuse: No     Past Psychiatric History:   Previous psychological history is significant for depression Outpatient  Providers: no previous experience History of Psych Hospitalization: No  Psychological Testing:  n/a    Abuse History:  Victim of: No.,  n/a    Report needed: No. Victim of Neglect:No. Perpetrator of  n/a   Witness / Exposure to Domestic Violence: No   Protective Services Involvement: No  Witness to [employer] Violence:  No   Family History: No family history on file.  Living situation: the Billy [name] lives alone  Sexual Orientation: Straight  Relationship Status: single  Name of spouse / other: n/a If a parent, number of children / ages: n/a  Support Systems: friends, parents, siblings, gaming community (Over Manufacturing systems engineer)  Museum/gallery curator Stress:  No   Income/Employment/Disability:  Employment Scientist, research (life sciences): No   Educational History: Education: Audiological scientist, focus interfered with being able to complete his degree  Religion/Sprituality/World View: N/a  Any cultural differences that may affect / interfere with treatment:  n/a  Recreation/Hobbies: Photography, fostering cat & kittens [facility]), travel  Stressors: Health problems    Strengths: Supportive Relationships, Family, and Friends, very independent  Barriers:  n/a   Legal History: Pending legal issue / charges: The Billy [name] has no significant history of legal issues. History of legal issue / charges:  n/a  Medical History/Surgical History: reviewed Past Medical History:  Diagnosis Date   Hypertension    Scoliosis    Seizures ([place])     Past Surgical History:  Procedure Laterality Date   BLADDER SURGERY     FOOT SURGERY     left   LEG SURGERY     left calf   LUMBAR SPINE SURGERY     x 2   OPEN ANTERIOR SHOULDER RECONSTRUCTION     Left    Medications: Current Outpatient Medications  Medication Sig Dispense Refill   amLODipine (NORVASC) 5 MG tablet Take 5 mg by mouth daily.     hydrochlorothiazide (MICROZIDE) 12.5 MG capsule Take 12.5 mg by mouth daily.  1   ibuprofen (ADVIL) 800 MG tablet Take 800 mg by mouth 3 (three) times daily.     levETIRAcetam (KEPPRA XR) 500 MG 24 hr tablet Take 1 tablet every morning and 2 tablets every night 270 tablet 3   oxycodone (OXY-IR) 5 MG capsule Take 1 capsule (5 mg total) by mouth every 4 (four) hours as needed (severe pain). 10 capsule 0   No current facility-administered medications for this visit.    Allergies  Allergen Reactions   Advil Pm [Ibuprofen-Diphenhydramine Cit]     Diagnoses:  No diagnosis found.  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 who comes referred by his PCP for depression.  Recently, Billy Billy [name] mood issues effected a situation at work.  He seems to have experienced a number of  losses in the past two years and doesn't seem to have processed these losses fully.  A related ambiguous loss is related to the ways in which his medical conditions have limited romantic relationships.  It is believed that Billy Billy [name] would benefit from a weekly individual psychotherapy which would focus on providing support through the grief process. Treatment would work to build positive coping strategies and skills which would allow Billy [name] to manage mood states more effectively as well as prevent relapse. Psychotherapy will also focus on past experiences with loss and help Billy [name] complete the grieving process. Therapist will continue to monitor need for medication and follow up with PCP for medication.   Royetta Crochet, PhD

## 2022-05-05 ENCOUNTER — Ambulatory Visit: Payer: BC Managed Care – PPO | Admitting: Psychology

## 2022-05-05 DIAGNOSIS — F331 Major depressive disorder, recurrent, moderate: Secondary | ICD-10-CM | POA: Diagnosis not present

## 2022-05-05 NOTE — Progress Notes (Signed)
PROGRESS NOTE:  Name: Billy [name] Date: 05/05/2022 MRN: [id number] DOB: Aug 17, 1975 PCP: [place], [place] At  Time spent: 12:00 - 12:55 PM  Guardian/Payee:  Self   Paperwork requested: Yes   I met today with [name] in office face-to-face in person individual psychotherapy.  In session today, we created Billy [name] treatment plan.  We d/ his needs and set goals.  Orey actively participated in the creation of his treatment plan and freely gave his consent.   Reason for Visit /Presenting Problem: Billy [name] is a 46 year old [place]   who was referred by his PCP.   He reports that there was an incident at work with a difficult fellow employee.  This led to d/ between the doctor and management but there were no repercussions.  Coworkers have noted that he doesn't seem as happy as he used to be.  [name] is generally a happy go lucky person.  He noticed that he has been struggling for a few years.  He lost his 33 year old dog Billy [name] Patient ([place] unexpectedly about a year and half ago.  Shayn also lost his older dog Billy [name] [phone]) this year.  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    Billy [name] broke up about year ago and it was a difficult relationship.  He doesn't use the word love very lightly and has only really loved one person.  He was slow to proclaim his feelings for this woman and the relationship ended.  She is an amazing woman but difficult due to longstanding mental health issues.  She once showed up at his work because she was suspicious that he was having an affair.  They continue to be friends.  He seems to take on people who need his help.  She apparently stopped taking her medication.  He tried on a number of occasions to get her the care she needed but eventually he had to give up (he "clicked off").  Growing up he always wanted to have a relationship but the bar he set was always really high.  And it's been difficult to be accepted with  his "condition."  Billy [name] has balance issues and can be unsteady on his feet.  He has scoliosis (genetic) and it has caused issues with his bladder, his knees, legs and surgeries in his spine.  He has struggled with people of his life time who are always trying to help him and this bothers him.    Mental Status Exam: Appearance:   Casual and Fairly Groomed     Behavior:  Appropriate  Motor:  Normal  Speech/Language:   NA  Affect:  Appropriate  Mood:  normal  Thought process:  normal  Thought content:    WNL  Sensory/Perceptual disturbances:    WNL  Orientation:  oriented to person, place, time/date, and situation  Attention:  Fair  Concentration:  Fair  Memory:  Recent;   [place] of knowledge:   Good  Insight:    Good  Judgment:   Good  Impulse Control:  Good     Individualized Treatment Plan                Strengths: verbal, reflective, motivated  Supports: friends   Goal/Needs for Treatment:  In order of importance to patient  1) Learn and implement coping skills and  strategies to manage depression 2) Process and grieve the loss of past relationships and manage feelings of loneliness 3)  Learn about depression and how to identify its signs to prevent relapse    Client Statement of Needs:  to get back to an "okay" place,    Treatment Level: Outpatient Individual Weekly Psychotherapy  Symptoms:  anhedonia, feeling down, disrupted sleep, fatigue, loss of concentration and felling bad about himself, feeling nervous, not being able to control worries, worrying about different things and feeling irritible.     Client Treatment Preferences:  Referred to this therapist by [place] consumer's goal for treatment:  Psychologist, Royetta Crochet, Ph.D. will support the patient's ability to achieve the goals identified. Cognitive Behavioral Therapy, Dialectical Behavioral Therapy, Motivational Interviewing, Behavior Activation and other evidenced-based practices will be  used to promote progress towards healthy functioning.   Healthcare consumer Billy [name] will: Actively participate in therapy, working towards healthy functioning.    *Justification for Continuation/Discontinuation of Goal: R=Revised, O=Ongoing, A=Achieved, D=Discontinued  Goal 1) Learn and implement coping skills and  strategies to manage depression  5 Point Likert rating baseline date: 05/05/2022 Target Date Goal Was reviewed Status Code Progress towards goal/Likert rating  05/06/2023          O              Goal 2) Process and grieve the loss of past relationships and manage feelings of loneliness   5 Point Likert rating baseline date : 05/05/2022 Target Date Goal Was reviewed Status Code Progress towards goal/Likert rating  05/06/2023          O              Goal 3) Learn about depression and how to identify its signs to prevent relapse  5 Point Likert rating baseline date: 05/05/2022 Target Date Goal Was reviewed Status Code Progress towards goal/Likert rating  05/06/2023           O              This plan has been reviewed and created by the following participants:  This plan will be reviewed at least every 12 months. Date Behavioral Health Clinician Date Guardian/Patient   05/05/2022  Royetta Crochet, Ph.D.  05/05/2022 Marline Backbone, PhD

## 2022-05-12 ENCOUNTER — Ambulatory Visit: Payer: BC Managed Care – PPO | Admitting: Psychology

## 2022-05-12 DIAGNOSIS — F331 Major depressive disorder, recurrent, moderate: Secondary | ICD-10-CM | POA: Diagnosis not present

## 2022-05-12 NOTE — Progress Notes (Signed)
PROGRESS NOTE:  Name: Billy [name] Date: 05/12/2022 MRN: [id number] DOB: [birth year] PCP: [place] [place] At  Time spent: 1:00 - 1:55 PM   I met today with Billy [name] in office face-to-face in person individual psychotherapy.   Reason for Visit /Presenting Problem: Billy [name] is a 46 year old [place]   who was referred by his PCP.   He reports that there was an incident at work with a difficult fellow employee.  This led to d/ between the doctor and management but there were no repercussions.  Coworkers have noted that he doesn't seem as happy as he used to be.  Billy [name] is generally a happy go lucky person.  He noticed that he has been struggling for a few years.  He lost his 54 year old dog Billy Patient ([place] unexpectedly about a year and half ago.  Zamere also lost his older dog Billy [name] [phone]) this year.  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    Billy [name] broke up about year ago and it was a difficult relationship.  He doesn't use the word love very lightly and has only really loved one person.  He was slow to proclaim his feelings for this woman and the relationship ended.  She is an amazing woman but difficult due to longstanding mental health issues.  She once showed up at his work because she was suspicious that he was having an affair.  They continue to be friends.  He seems to take on people who need his help.  She apparently stopped taking her medication.  He tried on a number of occasions to get her the care she needed but eventually he had to give up (he "clicked off").  Growing up he always wanted to have a relationship but the bar he set was always really high.  And it's been difficult to be accepted with his "condition."  Billy [name] has balance issues and can be unsteady on his feet.  He has scoliosis (genetic) and it has caused issues with his bladder, his knees, legs and surgeries in his spine.  He has struggled with people of his life time  who are always trying to help him and this bothers him.    Mental Status Exam: Appearance:   Casual and Fairly Groomed     Behavior:  Appropriate  Motor:  Normal  Speech/Language:   NA  Affect:  Appropriate  Mood:  normal  Thought process:  normal  Thought content:    WNL  Sensory/Perceptual disturbances:    WNL  Orientation:  oriented to person, place, time/date, and situation  Attention:  Fair  Concentration:  Fair  Memory:  Recent;   [place] of knowledge:   Good  Insight:    Good  Judgment:   Good  Impulse Control:  Good     Individualized Treatment Plan                Strengths: verbal, reflective, motivated  Supports: friends   Goal/Needs for Treatment:  In order of importance to patient  1) Learn and implement coping skills and  strategies to manage depression 2) Process and grieve the loss of past relationships and manage feelings of loneliness 3) Learn about depression and how to identify its signs to prevent relapse    Client Statement of Needs:  to get back to an "okay" place,    Treatment Level: Outpatient Individual Weekly Psychotherapy  Symptoms:  anhedonia, feeling down, disrupted  sleep, fatigue, loss of concentration and felling bad about himself, feeling nervous, not being able to control worries, worrying about different things and feeling irritible.     Client Treatment Preferences:  Referred to this therapist by [place] consumer's goal for treatment:  Psychologist, Royetta Crochet, Ph.D. will support the patient's ability to achieve the goals identified. Cognitive Behavioral Therapy, Dialectical Behavioral Therapy, Motivational Interviewing, Behavior Activation and other evidenced-based practices will be used to promote progress towards healthy functioning.   Healthcare consumer Billy [name] will: Actively participate in therapy, working towards healthy functioning.    *Justification for Continuation/Discontinuation of Goal: R=Revised,  O=Ongoing, A=Achieved, D=Discontinued  Goal 1) Learn and implement coping skills and  strategies to manage depression  5 Point Likert rating baseline date: 05/05/2022 Target Date Goal Was reviewed Status Code Progress towards goal/Likert rating  05/06/2023          O              Goal 2) Process and grieve the loss of past relationships and manage feelings of loneliness   5 Point Likert rating baseline date : 05/05/2022 Target Date Goal Was reviewed Status Code Progress towards goal/Likert rating  05/06/2023          O              Goal 3) Learn about depression and how to identify its signs to prevent relapse  5 Point Likert rating baseline date: 05/05/2022 Target Date Goal Was reviewed Status Code Progress towards goal/Likert rating  05/06/2023           O              This plan has been reviewed and created by the following participants:  This plan will be reviewed at least every 12 months. Date Behavioral Health Clinician Date Guardian/Patient   05/05/2022  Royetta Crochet, Ph.D.  05/05/2022 Billy [name] reports that he had a relatively good week.  In session, I made inquires to get a sense of what Billy [name] typical day and week is like.  We then talked about live style changes that would help support positive mood states.  I provided psycho education related to dopamine and increasing dopamine by deliberate action.  I also encouraged him to get back to some easy/gentle cardio until he gets his elbow taken care of.    Royetta Crochet, PhD

## 2022-05-19 ENCOUNTER — Ambulatory Visit (INDEPENDENT_AMBULATORY_CARE_PROVIDER_SITE_OTHER): Payer: BC Managed Care – PPO | Admitting: Psychology

## 2022-05-19 DIAGNOSIS — F331 Major depressive disorder, recurrent, moderate: Secondary | ICD-10-CM

## 2022-05-19 NOTE — Progress Notes (Signed)
PROGRESS NOTE:  Name: Billy [name] Billy [name] [name] Date: 05/19/2022 MRN: [id number] DOB: 1976-05-06 PCP: Billy [name] Billy [name] [name], Billy [name] Billy [name] [name] At  Time spent:  Start: 10:00 -AM End:  10:55 AM   I met today with Billy [name] Billy [name] [name] in office face-to-face in person individual psychotherapy.   Reason for Visit /Presenting Problem: Billy [name] Billy [name] [name] is a 46 year old Billy [name] Billy [name] [name]   who was referred by his PCP.   He reports that there was an incident at work with a difficult fellow employee.  This led to d/ between the doctor and management but there were no repercussions.  Coworkers have noted that he doesn't seem as happy as he used to be.  Billy [name] Billy [name] [name] is generally a happy go lucky person.  He noticed that he has been struggling for a few years.  He lost his 47 year old dog Billy [name] Billy [name] [name] ([place] unexpectedly about a year and half ago.  Billy [name] Billy [name] [name] also lost his older dog Billy [name] Billy [name] [name] [phone]) this year.  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    Billy [name] Billy [name] [name] broke up about year ago and it was a difficult relationship.  He doesn't use the word love very lightly and has only really loved one person.  He was slow to proclaim his feelings for this woman and the relationship ended.  She is an amazing woman but difficult due to longstanding mental health issues.  She once showed up at his work because she was suspicious that he was having an affair.  They continue to be friends.  He seems to take on people who need his help.  She apparently stopped taking her medication.  He tried on a number of occasions to get her the care she needed but eventually he had to give up (he "clicked off").  Growing up he always wanted to have a relationship but the bar he set was always really high.  And it's been difficult to be accepted with his "condition."  Billy [name] Billy [name] [name] has balance issues and can be unsteady on his feet.  He has scoliosis (genetic) and it has caused issues with his bladder, his knees, legs and surgeries in his spine.  He has struggled with  people of his life time who are always trying to help him and this bothers him.    Mental Status Exam: Appearance:   Casual and Fairly Groomed     Behavior:  Appropriate  Motor:  Normal  Speech/Language:   NA  Affect:  Appropriate  Mood:  normal  Thought process:  normal  Thought content:    WNL  Sensory/Perceptual disturbances:    WNL  Orientation:  oriented to person, place, time/date, and situation  Attention:  Fair  Concentration:  Fair  Memory:  Recent;   Billy [name] Billy [name] [name] of knowledge:   Good  Insight:    Good  Judgment:   Good  Impulse Control:  Good     Individualized Treatment Plan                Strengths: verbal, reflective, motivated  Supports: friends   Goal/Needs for Treatment:  In order of importance to Billy [name] [name]  1) Learn and implement coping skills and  strategies to manage depression 2) Process and grieve the loss of past relationships and manage feelings of loneliness 3) Learn about depression and how to identify its signs to prevent relapse    Client Statement of Needs:  to get back to an "okay" place,    Treatment Level: Outpatient Individual Weekly Psychotherapy  Symptoms:  anhedonia, feeling down, disrupted sleep, fatigue, loss of concentration and felling bad about himself, feeling nervous, not being able to control worries, worrying about different things and feeling irritible.     Client Treatment Preferences:  Referred to this therapist by Billy [name] Billy [name] [name] consumer's goal for treatment:  Psychologist, Billy [name] Billy [name] [name], Ph.D. will support the Billy [name] [name] ability to achieve the goals identified. Cognitive Behavioral Therapy, Dialectical Behavioral Therapy, Motivational Interviewing, Behavior Activation and other evidenced-based practices will be used to promote progress towards healthy functioning.   Healthcare consumer Billy [name] Billy [name] [name] will: Actively participate in therapy, working towards healthy functioning.    *Justification for  Continuation/Discontinuation of Goal: R=Revised, O=Ongoing, A=Achieved, D=Discontinued  Goal 1) Learn and implement coping skills and  strategies to manage depression  5 Point Likert rating baseline date: 05/05/2022 Target Date Goal Was reviewed Status Code Progress towards goal/Likert rating  05/06/2023          O              Goal 2) Process and grieve the loss of past relationships and manage feelings of loneliness   5 Point Likert rating baseline date : 05/05/2022 Target Date Goal Was reviewed Status Code Progress towards goal/Likert rating  05/06/2023          O              Goal 3) Learn about depression and how to identify its signs to prevent relapse  5 Point Likert rating baseline date: 05/05/2022 Target Date Goal Was reviewed Status Code Progress towards goal/Likert rating  05/06/2023           O              This plan has been reviewed and created by the following participants:  This plan will be reviewed at least every 12 months. Date Behavioral Health Clinician Date Guardian/Billy [name] [name]   05/05/2022  Billy [name] Billy [name] [name], Ph.D.  05/05/2022 Robie Ridge reports that he took our d/ about sleep more seriously and made some changes.  As a result he felt more rested and was surprised what a difference it made.  Daxon made a rode trio up to the mountains this weekend and it brought him some joy.  He is also trying to create some new habits.  List making and writing things down is particularly important given his trouble with memory.  The next area we are focusing on is getting Fadi to eat more nutritiously.  He notes that he has some strong aversions to certain foods (ie., fish, vegetables, spices).  We d/e/p his early history with foods, eating and food related habits.  I provided psycho education around nutrition, supplements and their potentially beneficial impact on mood states and general mental health.  I encouraged Sung to speak to his provided about  checking his Vitamin D levels.     Billy [name] Crochet, PhD

## 2022-05-26 ENCOUNTER — Ambulatory Visit (INDEPENDENT_AMBULATORY_CARE_PROVIDER_SITE_OTHER): Payer: BC Managed Care – PPO | Admitting: Psychology

## 2022-05-26 DIAGNOSIS — F331 Major depressive disorder, recurrent, moderate: Secondary | ICD-10-CM | POA: Diagnosis not present

## 2022-05-26 NOTE — Progress Notes (Signed)
PROGRESS NOTE:  Name: Billy [name] Date: 05/26/2022 MRN: [id number] DOB: [birth year] PCP: [facility], [place] At  Time spent:  Start: 8:00 -AM End:  8:55 AM   I met today with Billy [name] in office face-to-face in person individual psychotherapy.   Reason for Visit /Presenting Problem: Billy [name] is a 46 year old [place]   who was referred by his PCP.   He reports that there was an incident at work with a difficult fellow employee.  This led to d/ between the doctor and management but there were no repercussions.  Coworkers have noted that he doesn't seem as happy as he used to be.  Billy [name] is generally a happy go lucky person.  He noticed that he has been struggling for a few years.  He lost his 70 year old dog Billy [name] Patient ([place] unexpectedly about a year and half ago.  Billy [name] also lost his older dog Billy [name] [phone]) this year.  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    Billy [name] broke up about year ago and it was a difficult relationship.  He doesn't use the word love very lightly and has only really loved one person.  He was slow to proclaim his feelings for this woman and the relationship ended.  She is an amazing woman but difficult due to longstanding mental health issues.  She once showed up at his work because she was suspicious that he was having an affair.  They continue to be friends.  He seems to take on people who need his help.  She apparently stopped taking her medication.  He tried on a number of occasions to get her the care she needed but eventually he had to give up (he "clicked off").  Growing up he always wanted to have a relationship but the bar he set was always really high.  And it's been difficult to be accepted with his "condition."  Billy [name] has balance issues and can be unsteady on his feet.  He has scoliosis (genetic) and it has caused issues with his bladder, his knees, legs and surgeries in his spine.  He has struggled with  people of his life time who are always trying to help him and this bothers him.    Mental Status Exam: Appearance:   Casual and Fairly Groomed     Behavior:  Appropriate  Motor:  Normal  Speech/Language:   NA  Affect:  Appropriate  Mood:  normal  Thought process:  normal  Thought content:    WNL  Sensory/Perceptual disturbances:    WNL  Orientation:  oriented to person, place, time/date, and situation  Attention:  Fair  Concentration:  Fair  Memory:  Recent;   [place] of knowledge:   Good  Insight:    Good  Judgment:   Good  Impulse Control:  Good     Individualized Treatment Plan                Strengths: verbal, reflective, motivated  Supports: friends   Goal/Needs for Treatment:  In order of importance to patient  1) Learn and implement coping skills and  strategies to manage depression 2) Process and grieve the loss of past relationships and manage feelings of loneliness 3) Learn about depression and how to identify its signs to prevent relapse    Client Statement of Needs:  to get back to an "okay" place,    Treatment Level: Outpatient Individual Weekly Psychotherapy  Symptoms:  anhedonia, feeling down, disrupted sleep, fatigue, loss of concentration and felling bad about himself, feeling nervous, not being able to control worries, worrying about different things and feeling irritible.     Client Treatment Preferences:  Referred to this therapist by [place] consumer's goal for treatment:  Psychologist, Billy [name], Ph.D. will support the patient's ability to achieve the goals identified. Cognitive Behavioral Therapy, Dialectical Behavioral Therapy, Motivational Interviewing, Behavior Activation and other evidenced-based practices will be used to promote progress towards healthy functioning.   Healthcare consumer Billy [name] will: Actively participate in therapy, working towards healthy functioning.    *Justification for  Continuation/Discontinuation of Goal: R=Revised, O=Ongoing, A=Achieved, D=Discontinued  Goal 1) Learn and implement coping skills and  strategies to manage depression  5 Point Likert rating baseline date: 05/05/2022 Target Date Goal Was reviewed Status Code Progress towards goal/Likert rating  05/06/2023          O              Goal 2) Process and grieve the loss of past relationships and manage feelings of loneliness   5 Point Likert rating baseline date : 05/05/2022 Target Date Goal Was reviewed Status Code Progress towards goal/Likert rating  05/06/2023          O              Goal 3) Learn about depression and how to identify its signs to prevent relapse  5 Point Likert rating baseline date: 05/05/2022 Target Date Goal Was reviewed Status Code Progress towards goal/Likert rating  05/06/2023           O              This plan has been reviewed and created by the following participants:  This plan will be reviewed at least every 12 months. Date Behavioral Health Clinician Date Guardian/Patient   05/05/2022  Billy [name], Ph.D.  05/05/2022 Billy [name]                    Depression Rating:  2-6   Billy [name] reports that he f/t on a number of recommendations that were made in therapy to good effect.  He shared that a group from work went out to celebrate some birthdays.  He states that it made him sad because most others were paired up and he felt lonely.  As we d/e/p I noted that he presented a picture that skewed negative and didn't really match up with what he later described occurred.  I provided psycho education about the event-thought-feelings behavior chain, depressive cognitive distortions and need to shift focus.  Throughout session, I offered guidance around tips and strategies for better managing his low motivation and poor memory.  Home Practice:  measure space and order stove  Billy Crochet, PhD

## 2022-06-01 ENCOUNTER — Ambulatory Visit: Payer: BC Managed Care – PPO | Admitting: Neurology

## 2022-06-02 ENCOUNTER — Ambulatory Visit: Payer: BC Managed Care – PPO | Admitting: Psychology

## 2022-06-02 DIAGNOSIS — F331 Major depressive disorder, recurrent, moderate: Secondary | ICD-10-CM

## 2022-06-02 NOTE — Progress Notes (Signed)
PROGRESS NOTE:  Name: Billy [name] Date: 06/02/2022 MRN: [id number] DOB: 07/16/1976 PCP: [facility], [place] At  Time spent:  Start: 8:00 -AM End:  8:55 AM   I met today with Billy [name] in office face-to-face in person individual psychotherapy.   Reason for Visit /Presenting Problem: Billy [name] is a 46 year old [place]   who was referred by his PCP.   He reports that there was an incident at work with a difficult fellow employee.  This led to d/ between the doctor and management but there were no repercussions.  Coworkers have noted that he doesn't seem as happy as he used to be.  Billy [name] is generally a happy go lucky person.  He noticed that he has been struggling for a few years.  He lost his 31 year old dog Billy [name] Patient ([place] unexpectedly about a year and half ago.  [place] also lost his older dog Billy [name] [phone]) this year.  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    Billy [name] about year ago and it was a difficult relationship.  He doesn't use the [name] love very lightly and has only really loved one person.  He was slow to proclaim his feelings for this woman and the relationship ended.  She is an amazing woman but difficult due to longstanding mental health issues.  She once showed [name] at his work because she was suspicious that he was having an affair.  They continue to be friends.  He seems to take on people who need his help.  She apparently stopped taking her medication.  He tried on a number of occasions to get her the care she needed but eventually he had to give [name] (he "clicked off").  Growing [name] he always wanted to have a relationship but the bar he set was always really high.  And it's been difficult to be accepted with his "condition."  Billy [name] has balance issues and can be unsteady on his feet.  He has scoliosis (genetic) and it has caused issues with his bladder, his knees, legs and surgeries in his spine.  He has struggled with  people of his life time who are always trying to help him and this bothers him.    Mental Status Exam: Appearance:   Casual and Fairly Groomed     Behavior:  Appropriate  Motor:  Normal  Speech/Language:   NA  Affect:  Appropriate  Mood:  normal  Thought process:  normal  Thought content:    WNL  Sensory/Perceptual disturbances:    WNL  Orientation:  oriented to person, place, time/date, and situation  Attention:  Fair  Concentration:  Fair  Memory:  Recent;   [place] of knowledge:   Good  Insight:    Good  Judgment:   Good  Impulse Control:  Good     Individualized Treatment Plan                Strengths: verbal, reflective, motivated  Supports: friends   Goal/Needs for Treatment:  In order of importance to patient  1) Learn and implement coping skills and  strategies to manage depression 2) Process and grieve the loss of past relationships and manage feelings of loneliness 3) Learn about depression and how to identify its signs to prevent relapse    Client Statement of Needs:  to get back to an "okay" place,    Treatment Level: Outpatient Individual Weekly Psychotherapy  Symptoms:  anhedonia, feeling down, disrupted sleep, fatigue, loss of concentration and felling bad about himself, feeling nervous, not being able to control worries, worrying about different things and feeling irritible.     Client Treatment Preferences:  Referred to this therapist by [place] consumer's goal for treatment:  Psychologist, Billy [name], Ph.D. will support the patient's ability to achieve the goals identified. Cognitive Behavioral Therapy, Dialectical Behavioral Therapy, Motivational Interviewing, Behavior Activation and other evidenced-based practices will be used to promote progress towards healthy functioning.   Healthcare consumer Maxamus Colao will: Actively participate in therapy, working towards healthy functioning.    *Justification for  Continuation/Discontinuation of Goal: R=Revised, O=Ongoing, A=Achieved, D=Discontinued  Goal 1) Learn and implement coping skills and  strategies to manage depression  5 Point Likert rating baseline date: 05/05/2022 Target Date Goal Was reviewed Status Code Progress towards goal/Likert rating  05/06/2023          O              Goal 2) Process and grieve the loss of past relationships and manage feelings of loneliness   5 Point Likert rating baseline date : 05/05/2022 Target Date Goal Was reviewed Status Code Progress towards goal/Likert rating  05/06/2023          O              Goal 3) Learn about depression and how to identify its signs to prevent relapse  5 Point Likert rating baseline date: 05/05/2022 Target Date Goal Was reviewed Status Code Progress towards goal/Likert rating  05/06/2023           O              This plan has been reviewed and created by the following participants:  This plan will be reviewed at least every 12 months. Date Behavioral Health Clinician Date Guardian/Patient   05/05/2022  Billy [name], Ph.D.  05/05/2022 Billy [name]                    Depression Rating:  2-5   Dearies reports that he f/t and purchased the Anxiety and Phobia workbook as a primer for implementing CBT concepts.  I provided some psycho eduction He states that he read parts of the chapter on Self Talk as suggested.  I noted that he struggled with awareness of his thought process and that most negative thoughts terminated in his remembrances of a significant broken relationship.  We d/ ways to begin to interrupt this pathway and challenge his distorted thinking.   Home Practice:  measure space and order stove   Billy Crochet, PhD

## 2022-06-07 ENCOUNTER — Ambulatory Visit: Payer: BC Managed Care – PPO | Admitting: Psychology

## 2022-06-09 ENCOUNTER — Ambulatory Visit (INDEPENDENT_AMBULATORY_CARE_PROVIDER_SITE_OTHER): Payer: BC Managed Care – PPO | Admitting: Psychology

## 2022-06-09 DIAGNOSIS — F331 Major depressive disorder, recurrent, moderate: Secondary | ICD-10-CM | POA: Diagnosis not present

## 2022-06-09 NOTE — Progress Notes (Signed)
PROGRESS NOTE:  Name: Billy [name] Date: 06/09/2022 MRN: [id number] DOB: [birth year] PCP: [facility], [place] At  Time spent:  Start: 3:00 PM End:  3:55 PM   I met today with Billy [name] in office face-to-face in person individual psychotherapy.   Reason for Visit /Presenting Problem: Billy [name] is a 46 year old [place]   who was referred by his PCP.   He reports that there was an incident at work with a difficult fellow employee.  This led to d/ between the doctor and management but there were no repercussions.  Coworkers have noted that he doesn't seem as happy as he used to be.  Billy [name] is generally a happy go lucky person.  He noticed that he has been struggling for a few years.  He lost his 63 year old dog Billy [name] Patient ([place] unexpectedly about a year and half ago.  Billy [name] also lost his older dog Billy [name] [phone] this year.  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    Billy [name] broke up about year ago and it was a difficult relationship.  He doesn't use the word love very lightly and has only really loved one person.  He was slow to proclaim his feelings for this woman and the relationship ended.  She is an amazing woman but difficult due to longstanding mental health issues.  She once showed up at his work because she was suspicious that he was having an affair.  They continue to be friends.  He seems to take on people who need his help.  She apparently stopped taking her medication.  He tried on a number of occasions to get her the care she needed but eventually he had to give up (he "clicked off").  Growing up he always wanted to have a relationship but the bar he set was always really high.  And it's been difficult to be accepted with his "condition."  Billy [name] has balance issues and can be unsteady on his feet.  He has scoliosis (genetic) and it has caused issues with his bladder, his knees, legs and surgeries in his spine.  He has struggled with people  of his life time who are always trying to help him and this bothers him.    Mental Status Exam: Appearance:   Casual and Fairly Groomed     Behavior:  Appropriate  Motor:  Normal  Speech/Language:   NA  Affect:  Appropriate  Mood:  normal  Thought process:  normal  Thought content:    WNL  Sensory/Perceptual disturbances:    WNL  Orientation:  oriented to person, place, time/date, and situation  Attention:  Fair  Concentration:  Fair  Memory:  Recent;   [place] of knowledge:   Good  Insight:    Good  Judgment:   Good  Impulse Control:  Good     Individualized Treatment Plan                Strengths: verbal, reflective, motivated  Supports: friends   Goal/Needs for Treatment:  In order of importance to patient  1) Learn and implement coping skills and  strategies to manage depression 2) Process and grieve the loss of past relationships and manage feelings of loneliness 3) Learn about depression and how to identify its signs to prevent relapse    Client Statement of Needs:  to get back to an "okay" place,    Treatment Level: Outpatient Individual Weekly Psychotherapy  Symptoms:  anhedonia, feeling down, disrupted sleep, fatigue, loss of concentration and felling bad about himself, feeling nervous, not being able to control worries, worrying about different things and feeling irritible.     Client Treatment Preferences:  Referred to this therapist by [place] consumer's goal for treatment:  Psychologist, Billy [name], Ph.D. will support the patient's ability to achieve the goals identified. Cognitive Behavioral Therapy, Dialectical Behavioral Therapy, Motivational Interviewing, Behavior Activation and other evidenced-based practices will be used to promote progress towards healthy functioning.   Healthcare consumer Billy [name] will: Actively participate in therapy, working towards healthy functioning.    *Justification for Continuation/Discontinuation of  Goal: R=Revised, O=Ongoing, A=Achieved, D=Discontinued  Goal 1) Learn and implement coping skills and  strategies to manage depression  5 Point Likert rating baseline date: 05/05/2022 Target Date Goal Was reviewed Status Code Progress towards goal/Likert rating  05/06/2023          O              Goal 2) Process and grieve the loss of past relationships and manage feelings of loneliness   5 Point Likert rating baseline date : 05/05/2022 Target Date Goal Was reviewed Status Code Progress towards goal/Likert rating  05/06/2023          O              Goal 3) Learn about depression and how to identify its signs to prevent relapse  5 Point Likert rating baseline date: 05/05/2022 Target Date Goal Was reviewed Status Code Progress towards goal/Likert rating  05/06/2023           O              This plan has been reviewed and created by the following participants:  This plan will be reviewed at least every 12 months. Date Behavioral Health Clinician Date Guardian/Patient   05/05/2022  Billy [name], Ph.D.  05/05/2022 Billy [name]                    Depression Rating:  3-5   Billy [name] reports that he had his elbow procedure today.  He was in pain but managing.  We d/p his hopes that the procedure will improve the pain he has been experiencing.   We then moved on to the subject of anxiety and used the Anxiety and Phobia Workbook as an introduction to understanding what and how anxiety functions.     Home Practice:  measure space and order stove   Billy Crochet, PhD

## 2022-06-16 ENCOUNTER — Ambulatory Visit: Payer: BC Managed Care – PPO | Admitting: Psychology

## 2022-06-16 DIAGNOSIS — F331 Major depressive disorder, recurrent, moderate: Secondary | ICD-10-CM | POA: Diagnosis not present

## 2022-06-16 NOTE — Progress Notes (Signed)
PROGRESS NOTE:  Name: Billy [name] Date: 06/16/2022 MRN: [id number] DOB: May 03, 1976 PCP: [facility], [place] At  Time spent:  Start: 9:00 AM End:  9:55 AM   I met today with Billy [name] in office face-to-face in person individual psychotherapy.   Distance Site: Client's Home Orginating Site: Dr Jannifer Franklin Remote Office Consent: Obtained verbal consent to transmit  session remotely    Reason for Visit /Presenting Problem: Billy [name] is a 46 year old [place]   who was referred by his PCP.   He reports that there was an incident at work with a difficult fellow employee.  This led to d/ between the doctor and management but there were no repercussions.  Coworkers have noted that he doesn't seem as happy as he used to be.  Billy [name] is generally a happy go lucky person.  He noticed that he has been struggling for a few years.  He lost his 41 year old dog Billy [name] Patient ([place] unexpectedly about a year and half ago.  Billy [name] also lost his older dog Billy [name] [phone]) this year.  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    Billy [name] broke up about year ago and it was a difficult relationship.  He doesn't use the word love very lightly and has only really loved one person.  He was slow to proclaim his feelings for this woman and the relationship ended.  She is an amazing woman but difficult due to longstanding mental health issues.  She once showed up at his work because she was suspicious that he was having an affair.  They continue to be friends.  He seems to take on people who need his help.  She apparently stopped taking her medication.  He tried on a number of occasions to get her the care she needed but eventually he had to give up (he "clicked off").  Growing up he always wanted to have a relationship but the bar he set was always really high.  And it's been difficult to be accepted with his "condition."  Billy [name] has balance issues and can be unsteady on his feet.   He has scoliosis (genetic) and it has caused issues with his bladder, his knees, legs and surgeries in his spine.  He has struggled with people of his life time who are always trying to help him and this bothers him.    Mental Status Exam: Appearance:   Casual and Fairly Groomed     Behavior:  Appropriate  Motor:  Normal  Speech/Language:   NA  Affect:  Appropriate  Mood:  normal  Thought process:  normal  Thought content:    WNL  Sensory/Perceptual disturbances:    WNL  Orientation:  oriented to person, place, time/date, and situation  Attention:  Fair  Concentration:  Fair  Memory:  Recent;   [place] of knowledge:   Good  Insight:    Good  Judgment:   Good  Impulse Control:  Good     Individualized Treatment Plan                Strengths: verbal, reflective, motivated  Supports: friends   Goal/Needs for Treatment:  In order of importance to patient  1) Learn and implement coping skills and  strategies to manage depression 2) Process and grieve the loss of past relationships and manage feelings of loneliness 3) Learn about depression and how to identify its signs to prevent relapse    Client Statement  of Needs:  to get back to an "okay" place,    Treatment Level: Outpatient Individual Weekly Psychotherapy  Symptoms:  anhedonia, feeling down, disrupted sleep, fatigue, loss of concentration and felling bad about himself, feeling nervous, not being able to control worries, worrying about different things and feeling irritible.     Client Treatment Preferences:  Referred to this therapist by [place] consumer's goal for treatment:  Psychologist, Billy [name], Ph.D. will support the patient's ability to achieve the goals identified. Cognitive Behavioral Therapy, Dialectical Behavioral Therapy, Motivational Interviewing, Behavior Activation and other evidenced-based practices will be used to promote progress towards healthy functioning.   Healthcare consumer  Bandy Honaker will: Actively participate in therapy, working towards healthy functioning.    *Justification for Continuation/Discontinuation of Goal: R=Revised, O=Ongoing, A=Achieved, D=Discontinued  Goal 1) Learn and implement coping skills and  strategies to manage depression  5 Point Likert rating baseline date: 05/05/2022 Target Date Goal Was reviewed Status Code Progress towards goal/Likert rating  05/06/2023          O              Goal 2) Process and grieve the loss of past relationships and manage feelings of loneliness   5 Point Likert rating baseline date : 05/05/2022 Target Date Goal Was reviewed Status Code Progress towards goal/Likert rating  05/06/2023          O              Goal 3) Learn about depression and how to identify its signs to prevent relapse  5 Point Likert rating baseline date: 05/05/2022 Target Date Goal Was reviewed Status Code Progress towards goal/Likert rating  05/06/2023           O              This plan has been reviewed and created by the following participants:  This plan will be reviewed at least every 12 months. Date Behavioral Health Clinician Date Guardian/Patient   05/05/2022  Billy [name], Ph.D.  05/05/2022 Billy [name]                    Depression Rating:  3-5   Billy [name] reports that he had his elbow procedure and seems to be recovering well.  We d/p that his movement and ability to do certain tasks is limited which can be frustrating.  He is still experiencing some pain but it is decreasing with time.  I encouraged him to ask his provider to refer him for PT.    Billy [name] states that while he measured the space for his stove, he had not ordered the appliance.  We d/e/p that what he is hoping to do is get a home equity loan to finally address some other issues with his condo that have needed care (ie., a non-functioning bathroom, etc.).  It led me to a D with Billy [name] about his environment - how it may impact his mood and enjoyment of  his space, or how it impacts others he invites into his home.  The d/ seemed eye opening to [place] and we agreed he would give it some more thought.     Home Practice:  measure space and order stove   Billy Crochet, PhD

## 2022-06-30 ENCOUNTER — Ambulatory Visit: Payer: BC Managed Care – PPO | Admitting: Psychology

## 2022-07-07 ENCOUNTER — Ambulatory Visit: Payer: BC Managed Care – PPO | Admitting: Psychology

## 2022-08-04 ENCOUNTER — Ambulatory Visit: Payer: BC Managed Care – PPO | Admitting: Psychology

## 2022-08-18 ENCOUNTER — Ambulatory Visit (INDEPENDENT_AMBULATORY_CARE_PROVIDER_SITE_OTHER): Payer: BC Managed Care – PPO | Admitting: Psychology

## 2022-08-18 DIAGNOSIS — F331 Major depressive disorder, recurrent, moderate: Secondary | ICD-10-CM | POA: Diagnosis not present

## 2022-08-18 NOTE — Progress Notes (Signed)
PROGRESS NOTE:  Name: Billy [name] Date: 08/18/2022 MRN: [id number] DOB: 1976-05-20 PCP: [facility], [place] At  Time spent:  Start: 9:00 AM End:  9:55 AM   I met today with Billy [name] in office face-to-face in person individual psychotherapy.   Reason for Visit /Presenting Problem: Billy [name] is a 47 year old [place]   who was referred by his PCP.   He reports that there was an incident at work with a difficult fellow employee.  This led to d/ between the doctor and management but there were no repercussions.  Coworkers have noted that he doesn't seem as happy as he used to be.  Billy [name] is generally a happy go lucky person.  He noticed that he has been struggling for a few years.  He lost his 63 year old dog Billy [name] ([place] unexpectedly about a year and half ago.  [place] also lost his older dog Billy [name] [phone]) this year.  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    Hernando broke up about year ago and it was a difficult relationship.  He doesn't use the word love very lightly and has only really loved one person.  He was slow to proclaim his feelings for this woman and the relationship ended.  She is an amazing woman but difficult due to longstanding mental health issues.  She once showed up at his work because she was suspicious that he was having an affair.  They continue to be friends.  He seems to take on people who need his help.  She apparently stopped taking her medication.  He tried on a number of occasions to get her the care she needed but eventually he had to give up (he "clicked off").  Growing up he always wanted to have a relationship but the bar he set was always really high.  And it's been difficult to be accepted with his "condition."  Billy [name] has balance issues and can be unsteady on his feet.  He has scoliosis (genetic) and it has caused issues with his bladder, his knees, legs and surgeries in his spine.  He has struggled with people  of his life time who are always trying to help him and this bothers him.    Mental Status Exam: Appearance:   Casual and Fairly Groomed     Behavior:  Appropriate  Motor:  Normal  Speech/Language:   NA  Affect:  Appropriate  Mood:  normal  Thought process:  normal  Thought content:    WNL  Sensory/Perceptual disturbances:    WNL  Orientation:  oriented to person, place, time/date, and situation  Attention:  Fair  Concentration:  Fair  Memory:  Recent;   [name] of knowledge:   Good  Insight:    Good  Judgment:   Good  Impulse Control:  Good     Individualized Treatment Plan                Strengths: verbal, reflective, motivated  Supports: friends   Goal/Needs for Treatment:  In order of importance to [name]  1) Learn and implement coping skills and  strategies to manage depression 2) Process and grieve the loss of past relationships and manage feelings of loneliness 3) Learn about depression and how to identify its signs to prevent relapse    Client Statement of Needs:  to get back to an "okay" place,    Treatment Level: Outpatient Individual Weekly Psychotherapy  Symptoms:  anhedonia, feeling down, disrupted sleep, fatigue, loss of concentration and felling bad about himself, feeling nervous, not being able to control worries, worrying about different things and feeling irritible.     Client Treatment Preferences:  Referred to this therapist by [place] consumer's goal for treatment:  Psychologist, Royetta Crochet, Ph.D. will support the [name] ability to achieve the goals identified. Cognitive Behavioral Therapy, Dialectical Behavioral Therapy, Motivational Interviewing, Behavior Activation and other evidenced-based practices will be used to promote progress towards healthy functioning.   Healthcare consumer Billy [name] will: Actively participate in therapy, working towards healthy functioning.    *Justification for Continuation/Discontinuation of  Goal: R=Revised, O=Ongoing, A=Achieved, D=Discontinued  Goal 1) Learn and implement coping skills and  strategies to manage depression  5 Point Likert rating baseline date: 05/05/2022 Target Date Goal Was reviewed Status Code Progress towards goal/Likert rating  05/06/2023          O              Goal 2) Process and grieve the loss of past relationships and manage feelings of loneliness   5 Point Likert rating baseline date : 05/05/2022 Target Date Goal Was reviewed Status Code Progress towards goal/Likert rating  05/06/2023          O              Goal 3) Learn about depression and how to identify its signs to prevent relapse  5 Point Likert rating baseline date: 05/05/2022 Target Date Goal Was reviewed Status Code Progress towards goal/Likert rating  05/06/2023           O              This plan has been reviewed and created by the following participants:  This plan will be reviewed at least every 12 months. Date Behavioral Health Clinician Date Guardian/[name]   05/05/2022  Royetta Crochet, Ph.D.  05/05/2022 Billy [name]                    Depression Rating:  3-5   Billy [name] reports that he dropped his phone on his toe and it's probably broken.  We d/ that he was not fully awake and not paying attention when he dropped his phone on his toe.  We d/p that he has been struggling in the mornings for a while now.  Masahiro reports that he is also having a hard time with motivation and getting things done.  He has been going to the gym with his sister.  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  In session, we talked about adjusting his morning routine, addressing and minimizing distractions.  Lastly, we talked about medication (ie., Wellbutrin) and he agreed to reach out to his PCP.   Home Practice:  call PCP for medication evaluation   Royetta Crochet, PhD

## 2022-08-29 ENCOUNTER — Other Ambulatory Visit: Payer: Self-pay | Admitting: Neurology

## 2022-08-29 DIAGNOSIS — G40009 Localization-related (focal) (partial) idiopathic epilepsy and epileptic syndromes with seizures of localized onset, not intractable, without status epilepticus: Secondary | ICD-10-CM

## 2022-08-29 NOTE — Telephone Encounter (Signed)
Pt called left a voice mail to call the office back

## 2022-08-29 NOTE — Telephone Encounter (Signed)
Can you pls call him and let him know that he HAS to attend the May appt, we will send refills until then but cannot continue refilling if he does not see [place] once a year per office policty, thanks

## 2022-08-30 NOTE — Telephone Encounter (Signed)
Pt called left a voice mail to call the office back  ?

## 2022-09-01 ENCOUNTER — Ambulatory Visit: Payer: BC Managed Care – PPO | Admitting: Psychology

## 2022-09-01 DIAGNOSIS — F331 Major depressive disorder, recurrent, moderate: Secondary | ICD-10-CM | POA: Diagnosis not present

## 2022-09-01 NOTE — Progress Notes (Signed)
PROGRESS NOTE:  Name: Billy [name] Date: 09/01/2022 MRN: [id number] DOB: Nov 25, 1975 PCP: [facility], [place] At  Time spent:  Start: 10:00 AM End:  10:55 AM   Today I met with [name] in office face-to-face in person individual psychotherapy.   Reason for Visit /Presenting Problem: Billy [name] is a 47 year old [place]   who was referred by his PCP.   He reports that there was an incident at work with a difficult fellow employee.  This led to d/ between the doctor and management but there were no repercussions.  Coworkers have noted that he doesn't seem as happy as he used to be.  Billy [name] is generally a happy go lucky person.  He noticed that he has been struggling for a few years.  He lost his 29 year old dog Randell Patient ([place] unexpectedly about a year and half ago.  Billy [name] also lost his older dog [name] [phone]) this year.  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    Atif broke up about year ago and it was a difficult relationship.  He doesn't use the word love very lightly and has only really loved one person.  He was slow to proclaim his feelings for this woman and the relationship ended.  She is an amazing woman but difficult due to longstanding mental health issues.  She once showed up at his work because she was suspicious that he was having an affair.  They continue to be friends.  He seems to take on people who need his help.  She apparently stopped taking her medication.  He tried on a number of occasions to get her the care she needed but eventually he had to give up (he "clicked off").  Growing up he always wanted to have a relationship but the bar he set was always really high.  And it's been difficult to be accepted with his "condition."  Billy [name] has balance issues and can be unsteady on his feet.  He has scoliosis (genetic) and it has caused issues with his bladder, his knees, legs and surgeries in his spine.  He has struggled with  people of his life time who are always trying to help him and this bothers him.    Mental Status Exam: Appearance:   Casual and Fairly Groomed     Behavior:  Appropriate  Motor:  Normal  Speech/Language:   NA  Affect:  Appropriate  Mood:  normal  Thought process:  normal  Thought content:    WNL  Sensory/Perceptual disturbances:    WNL  Orientation:  oriented to person, place, time/date, and situation  Attention:  Fair  Concentration:  Fair  Memory:  Recent;   [place] of knowledge:   Good  Insight:    Good  Judgment:   Good  Impulse Control:  Good     Individualized Treatment Plan                Strengths: verbal, reflective, motivated  Supports: friends   Goal/Needs for Treatment:  In order of importance to patient  1) Learn and implement coping skills and  strategies to manage depression 2) Process and grieve the loss of past relationships and manage feelings of loneliness 3) Learn about depression and how to identify its signs to prevent relapse    Client Statement of Needs:  to get back to an "okay" place,    Treatment Level: Outpatient Individual Weekly Psychotherapy  Symptoms:  anhedonia, feeling down, disrupted sleep, fatigue, loss of concentration and felling bad about himself, feeling nervous, not being able to control worries, worrying about different things and feeling irritible.     Client Treatment Preferences:  Referred to this therapist by [place] consumer's goal for treatment:  Psychologist, Royetta Crochet, Ph.D. will support the patient's ability to achieve the goals identified. Cognitive Behavioral Therapy, Dialectical Behavioral Therapy, Motivational Interviewing, Behavior Activation and other evidenced-based practices will be used to promote progress towards healthy functioning.   Healthcare consumer Billy [name] will: Actively participate in therapy, working towards healthy functioning.    *Justification for  Continuation/Discontinuation of Goal: R=Revised, O=Ongoing, A=Achieved, D=Discontinued  Goal 1) Learn and implement coping skills and  strategies to manage depression  5 Point Likert rating baseline date: 05/05/2022 Target Date Goal Was reviewed Status Code Progress towards goal/Likert rating  05/06/2023          O              Goal 2) Process and grieve the loss of past relationships and manage feelings of loneliness   5 Point Likert rating baseline date : 05/05/2022 Target Date Goal Was reviewed Status Code Progress towards goal/Likert rating  05/06/2023          O              Goal 3) Learn about depression and how to identify its signs to prevent relapse  5 Point Likert rating baseline date: 05/05/2022 Target Date Goal Was reviewed Status Code Progress towards goal/Likert rating  05/06/2023           O              This plan has been reviewed and created by the following participants:  This plan will be reviewed at least every 12 months. Date Behavioral Health Clinician Date Guardian/Patient   05/05/2022  Royetta Crochet, Ph.D.  05/05/2022 [name]                    Depression Rating:  3-5  [name] reports that he was able to take a telemedicine appointment last week with his provider.  They concurred that Wellbutrin (150 mg) was a good choice given his symptoms.  So far he has only taken it for two days.  We'll check in a couple of weeks to see how he is doing.  He also had blood labs which indicated low Vitamin D and is now taking his supplements.    Billy [name] grandmother is still in the process of grieving the loss of her partner of 30 years.  He admits that he never had a close relationship with him.  After inquiring, he admitted that this is a pattern of not being close to family.  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  Lastly, we d/ how to begin to take small steps toward improving his nutrition and routines around food  preparation. We identified that "forgetting" is a key contributing factor.  I provided information and guidance on strategies to work with his ADHD and improve the chances of remembering to complete tasks.   Home Practice:     Royetta Crochet, PhD

## 2022-09-15 ENCOUNTER — Ambulatory Visit: Payer: BC Managed Care – PPO | Admitting: Psychology

## 2022-09-15 DIAGNOSIS — F331 Major depressive disorder, recurrent, moderate: Secondary | ICD-10-CM | POA: Diagnosis not present

## 2022-09-15 NOTE — Progress Notes (Signed)
PROGRESS NOTE:  Name: Billy [name] Date: 09/15/2022 MRN: [id number] DOB: 1976-07-03 PCP: [place], [place] At  Time spent:  Start: 10:00 AM End:  10:55 AM   Today I met with Billy [name] in office face-to-face in person individual psychotherapy.   Reason for Visit /Presenting Problem: Billy [name] is a 47 year old [place]   who was referred by his PCP.   He reports that there was an incident at work with a difficult fellow employee.  This led to d/ between the doctor and management but there were no repercussions.  Coworkers have noted that he doesn't seem as happy as he used to be.  Billy [name] is generally a happy go lucky person.  He noticed that he has been struggling for a few years.  He lost his 79 year old dog Billy [name] Patient ([place] unexpectedly about a year and half ago.  Billy [name] also lost his older dog Billy [name] [phone]) this year.  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    Billy [name] broke up about year ago and it was a difficult relationship.  He doesn't use the word love very lightly and has only really loved one person.  He was slow to proclaim his feelings for this woman and the relationship ended.  She is an amazing woman but difficult due to longstanding mental health issues.  She once showed up at his work because she was suspicious that he was having an affair.  They continue to be friends.  He seems to take on people who need his help.  She apparently stopped taking her medication.  He tried on a number of occasions to get her the care she needed but eventually he had to give up (he "clicked off").  Growing up he always wanted to have a relationship but the bar he set was always really high.  And it's been difficult to be accepted with his "condition."  Billy [name] has balance issues and can be unsteady on his feet.  He has scoliosis (genetic) and it has caused issues with his bladder, his knees, legs and surgeries in his spine.  He has struggled with  people of his life time who are always trying to help him and this bothers him.    Mental Status Exam: Appearance:   Casual and Fairly Groomed     Behavior:  Appropriate  Motor:  Normal  Speech/Language:   NA  Affect:  Appropriate  Mood:  normal  Thought process:  normal  Thought content:    WNL  Sensory/Perceptual disturbances:    WNL  Orientation:  oriented to person, place, time/date, and situation  Attention:  Fair  Concentration:  Fair  Memory:  Recent;   Billy [name] of knowledge:   Good  Insight:    Good  Judgment:   Good  Impulse Control:  Good     Individualized Treatment Plan                Strengths: verbal, reflective, motivated  Supports: friends   Goal/Needs for Treatment:  In order of importance to patient  1) Learn and implement coping skills and  strategies to manage depression 2) Process and grieve the loss of past relationships and manage feelings of loneliness 3) Learn about depression and how to identify its signs to prevent relapse    Client Statement of Needs:  to get back to an "okay" place,    Treatment Level: Outpatient Individual Weekly Psychotherapy  Symptoms:  anhedonia, feeling down, disrupted sleep, fatigue, loss of concentration and felling bad about himself, feeling nervous, not being able to control worries, worrying about different things and feeling irritible.     Client Treatment Preferences:  Referred to this therapist by [place] consumer's goal for treatment:  Psychologist, Royetta Crochet, Ph.D. will support the patient's ability to achieve the goals identified. Cognitive Behavioral Therapy, Dialectical Behavioral Therapy, Motivational Interviewing, Behavior Activation and other evidenced-based practices will be used to promote progress towards healthy functioning.   Healthcare consumer Billy [name] will: Actively participate in therapy, working towards healthy functioning.    *Justification for  Continuation/Discontinuation of Goal: R=Revised, O=Ongoing, A=Achieved, D=Discontinued  Goal 1) Learn and implement coping skills and  strategies to manage depression  5 Point Likert rating baseline date: 05/05/2022 Target Date Goal Was reviewed Status Code Progress towards goal/Likert rating  05/06/2023          O              Goal 2) Process and grieve the loss of past relationships and manage feelings of loneliness   5 Point Likert rating baseline date : 05/05/2022 Target Date Goal Was reviewed Status Code Progress towards goal/Likert rating  05/06/2023          O              Goal 3) Learn about depression and how to identify its signs to prevent relapse  5 Point Likert rating baseline date: 05/05/2022 Target Date Goal Was reviewed Status Code Progress towards goal/Likert rating  05/06/2023           O              This plan has been reviewed and created by the following participants:  This plan will be reviewed at least every 12 months. Date Behavioral Health Clinician Date Guardian/Patient   05/05/2022  Royetta Crochet, Ph.D.  05/05/2022 Billy [name]                    Depression Rating:    Billy [name] reports that he is feeling better - more focused, getting everything done in a more efficient manner than before at work.  He notes that he is sleeping a bit better and wakes feeling less tired.  He says that he's less irritable after dealing with difficult patients.  We d/e/p the behavior chain typical of these events and where he might intervene to better manage his negative thoughts and mood states.     Home Practice:     Royetta Crochet, PhD

## 2022-09-29 ENCOUNTER — Ambulatory Visit: Payer: BC Managed Care – PPO | Admitting: Psychology

## 2022-09-29 DIAGNOSIS — F331 Major depressive disorder, recurrent, moderate: Secondary | ICD-10-CM

## 2022-09-29 NOTE — Progress Notes (Signed)
PROGRESS NOTE:  Name: Billy [name] Date: 09/29/2022 MRN: [id number] DOB: [birth year] PCP: [place], [place] At  Time spent:  Start: 8:00 AM End:  8:55 AM   Today I met with Billy [name] in office face-to-face in person individual psychotherapy.   Reason for Visit /Presenting Problem: Billy [name] is a 47 year old [place]   who was referred by his PCP.   He reports that there was an incident at work with a difficult fellow employee.  This led to d/ between the doctor and management but there were no repercussions.  Coworkers have noted that he doesn't seem as happy as he used to be.  [name] is generally a happy go lucky person.  He noticed that he has been struggling for a few years.  He lost his 59 year old dog Billy [name] ([place] unexpectedly about a year and half ago.  Billy [name] also lost his older dog Billy [name] [phone]) this year.  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    [name] broke up about year ago and it was a difficult relationship.  He doesn't use the word love very lightly and has only really loved one person.  He was slow to proclaim his feelings for this woman and the relationship ended.  She is an amazing woman but difficult due to longstanding mental health issues.  She once showed up at his work because she was suspicious that he was having an affair.  They continue to be friends.  He seems to take on people who need his help.  She apparently stopped taking her medication.  He tried on a number of occasions to get her the care she needed but eventually he had to give up (he "clicked off").  Growing up he always wanted to have a relationship but the bar he set was always really high.  And it's been difficult to be accepted with his "condition."  Dois has balance issues and can be unsteady on his feet.  He has scoliosis (genetic) and it has caused issues with his bladder, his knees, legs and surgeries in his spine.  He has struggled with people  of his life time who are always trying to help him and this bothers him.    Mental Status Exam: Appearance:   Casual and Fairly Groomed     Behavior:  Appropriate  Motor:  Normal  Speech/Language:   NA  Affect:  Appropriate  Mood:  normal  Thought process:  normal  Thought content:    WNL  Sensory/Perceptual disturbances:    WNL  Orientation:  oriented to person, place, time/date, and situation  Attention:  Fair  Concentration:  Fair  Memory:  Recent;   [place] of knowledge:   Good  Insight:    Good  Judgment:   Good  Impulse Control:  Good     Individualized Treatment Plan                Strengths: verbal, reflective, motivated  Supports: friends   Goal/Needs for Treatment:  In order of importance to [name]  1) Learn and implement coping skills and  strategies to manage depression 2) Process and grieve the loss of past relationships and manage feelings of loneliness 3) Learn about depression and how to identify its signs to prevent relapse    Client Statement of Needs:  to get back to an "okay" place,    Treatment Level: Outpatient Individual Weekly Psychotherapy  Symptoms:  anhedonia, feeling down, disrupted sleep, fatigue, loss of concentration and felling bad about himself, feeling nervous, not being able to control worries, worrying about different things and feeling irritible.     Client Treatment Preferences:  Referred to this therapist by [place] consumer's goal for treatment:  Psychologist, Royetta Crochet, Ph.D. will support the [name] ability to achieve the goals identified. Cognitive Behavioral Therapy, Dialectical Behavioral Therapy, Motivational Interviewing, Behavior Activation and other evidenced-based practices will be used to promote progress towards healthy functioning.   Healthcare consumer [name] will: Actively participate in therapy, working towards healthy functioning.    *Justification for Continuation/Discontinuation of  Goal: R=Revised, O=Ongoing, A=Achieved, D=Discontinued  Goal 1) Learn and implement coping skills and  strategies to manage depression  5 Point Likert rating baseline date: 05/05/2022 Target Date Goal Was reviewed Status Code Progress towards goal/Likert rating  05/06/2023          O              Goal 2) Process and grieve the loss of past relationships and manage feelings of loneliness   5 Point Likert rating baseline date : 05/05/2022 Target Date Goal Was reviewed Status Code Progress towards goal/Likert rating  05/06/2023          O              Goal 3) Learn about depression and how to identify its signs to prevent relapse  5 Point Likert rating baseline date: 05/05/2022 Target Date Goal Was reviewed Status Code Progress towards goal/Likert rating  05/06/2023           O              This plan has been reviewed and created by the following participants:  This plan will be reviewed at least every 12 months. Date Behavioral Health Clinician Date Guardian/[name]   05/05/2022  Royetta Crochet, Ph.D.  05/05/2022 Billy [name]                     Depression Rating:    Billy [name] reports that he lost electricity yesterday for several hours. He was tired this morning because the lights woke him and the cats up in the middle of the night.  He also shared that his ski trip got cancelled because of weather.  We d/e his desire to travel but that he always has a hard time "puling the trigger" and some of the factors that keep him from going.  As related to his desire to travel, we d/ the power of anticipating positive events and how to begin to address the barriers that keep him from traveling.   Home Practice:  spend some time researching potential travel locals   Royetta Crochet, PhD

## 2022-10-27 ENCOUNTER — Ambulatory Visit: Payer: BC Managed Care – PPO | Admitting: Psychology

## 2022-11-25 ENCOUNTER — Other Ambulatory Visit: Payer: Self-pay | Admitting: Neurology

## 2022-11-25 DIAGNOSIS — G40009 Localization-related (focal) (partial) idiopathic epilepsy and epileptic syndromes with seizures of localized onset, not intractable, without status epilepticus: Secondary | ICD-10-CM

## 2022-12-15 ENCOUNTER — Ambulatory Visit: Payer: BC Managed Care – PPO | Admitting: Psychology

## 2022-12-20 ENCOUNTER — Ambulatory Visit: Payer: BC Managed Care – PPO | Admitting: Neurology

## 2022-12-29 ENCOUNTER — Ambulatory Visit: Payer: BC Managed Care – PPO | Admitting: Psychology

## 2023-01-19 ENCOUNTER — Ambulatory Visit: Payer: BC Managed Care – PPO | Admitting: Psychology

## 2023-02-16 ENCOUNTER — Ambulatory Visit: Payer: BC Managed Care – PPO | Admitting: Psychology

## 2023-02-28 ENCOUNTER — Other Ambulatory Visit: Payer: Self-pay | Admitting: Neurology

## 2023-02-28 DIAGNOSIS — G40009 Localization-related (focal) (partial) idiopathic epilepsy and epileptic syndromes with seizures of localized onset, not intractable, without status epilepticus: Secondary | ICD-10-CM

## 2023-03-02 ENCOUNTER — Ambulatory Visit: Payer: BC Managed Care – PPO | Admitting: Psychology

## 2023-03-15 IMAGING — DX DG ELBOW COMPLETE 3+V*R*
4 series · 4 of 4 positions shown · non-contrast
Comparison: None Available.

CLINICAL DATA: Right elbow pain, tendinosis

EXAM:
RIGHT ELBOW - COMPLETE 3+ VIEW

[elbow ap]
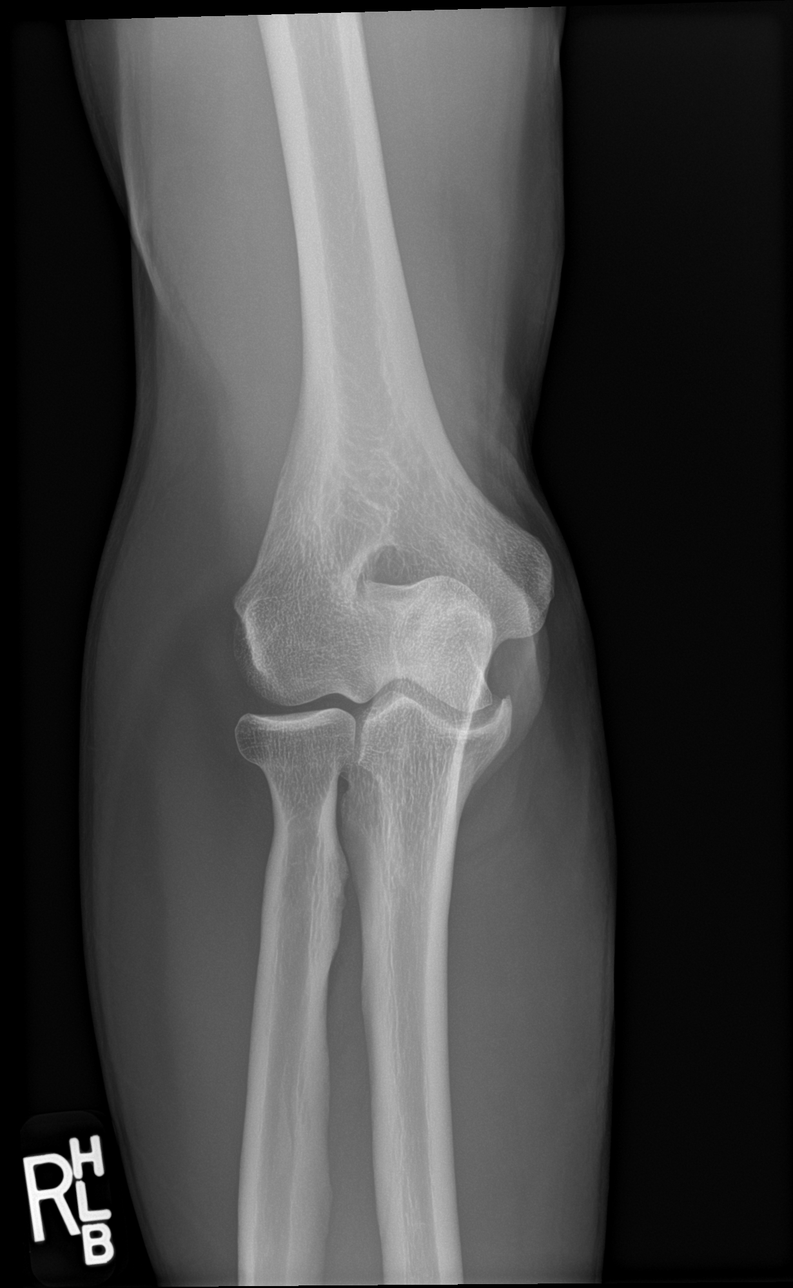

[elbow obl (1 of 2)]
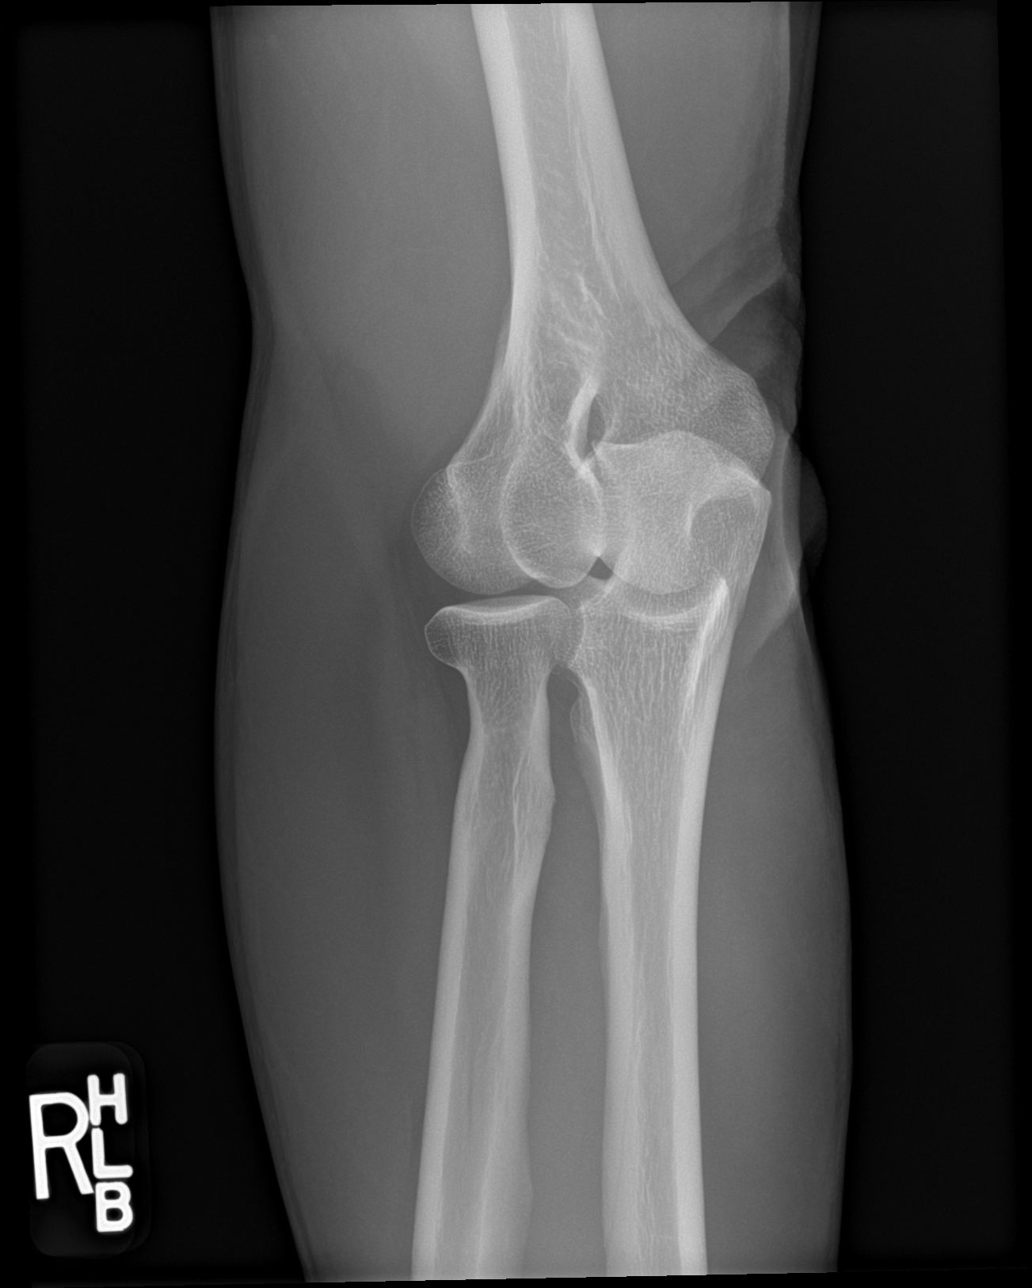

[elbow obl (2 of 2)]
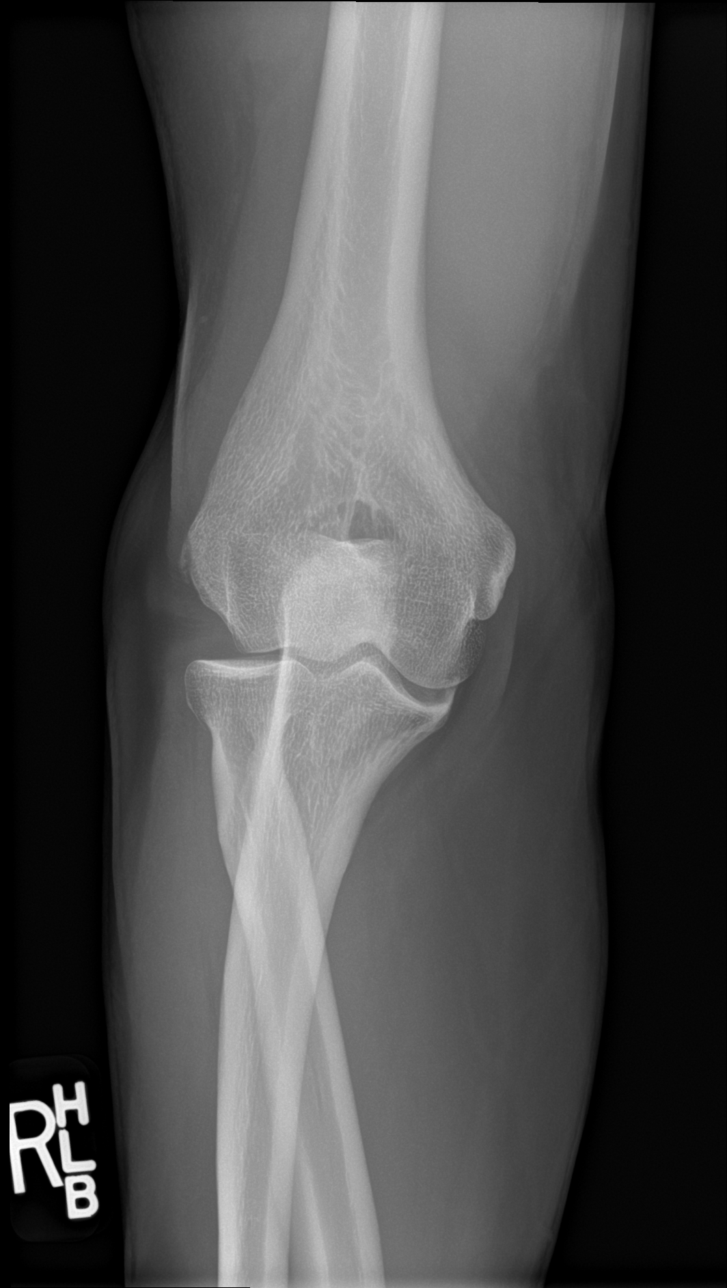

[elbow lat]
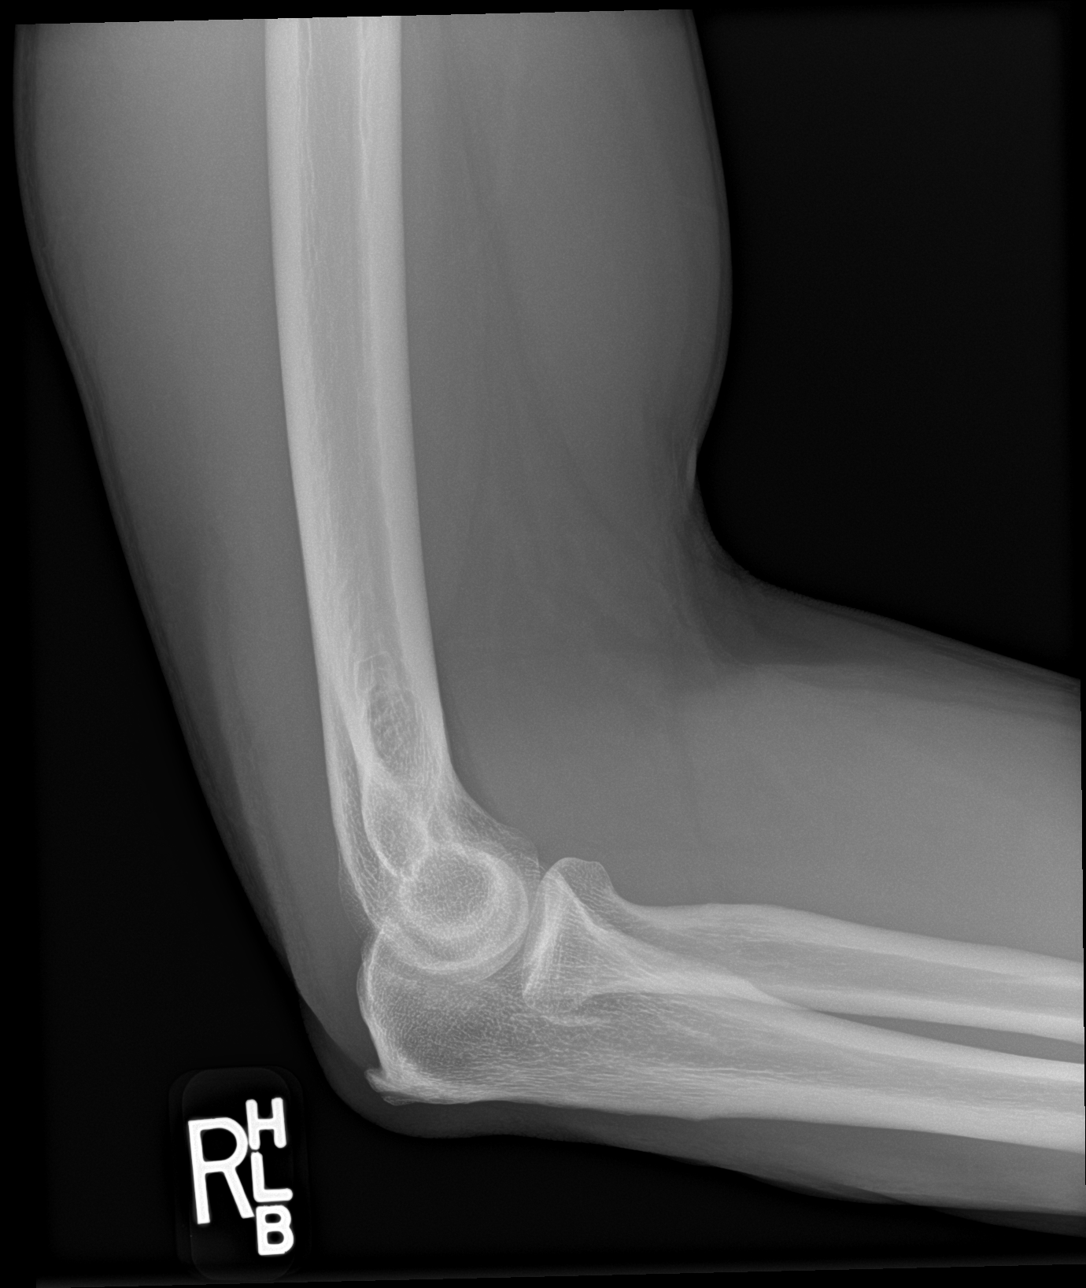

[4 of 4 positions shown; findings below may reference images not displayed]

FINDINGS: There is no evidence of fracture, dislocation, or joint effusion.
There is no evidence of arthropathy or other focal bone abnormality.
Soft tissues are unremarkable.
IMPRESSION: Negative.

## 2023-03-16 ENCOUNTER — Ambulatory Visit: Payer: BC Managed Care – PPO | Admitting: Psychology

## 2023-03-30 ENCOUNTER — Ambulatory Visit: Payer: BC Managed Care – PPO | Admitting: Psychology

## 2023-03-31 ENCOUNTER — Other Ambulatory Visit: Payer: Self-pay | Admitting: Neurology

## 2023-03-31 DIAGNOSIS — G40009 Localization-related (focal) (partial) idiopathic epilepsy and epileptic syndromes with seizures of localized onset, not intractable, without status epilepticus: Secondary | ICD-10-CM

## 2023-04-07 ENCOUNTER — Telehealth: Payer: Self-pay | Admitting: Neurology

## 2023-04-07 DIAGNOSIS — G40009 Localization-related (focal) (partial) idiopathic epilepsy and epileptic syndromes with seizures of localized onset, not intractable, without status epilepticus: Secondary | ICD-10-CM

## 2023-04-07 MED ORDER — LEVETIRACETAM ER 500 MG PO TB24: ORAL_TABLET | ORAL | 1 refills | Status: DC

## 2023-04-07 NOTE — Telephone Encounter (Signed)
Pt called he did not have a concern he needed an appointment so he could get a refill on his levetiracetam, pt was advised that I would send in a refill of his medication but 1st I was going to transfer him to the front to get him scheduled for an appointment

## 2023-04-07 NOTE — Telephone Encounter (Signed)
Pt missed call, was returning call back to nurse

## 2023-04-07 NOTE — Telephone Encounter (Signed)
Pt called to follow up to see what his concerns where

## 2023-04-07 NOTE — Telephone Encounter (Signed)
Patient is requesting a call back concerning patient

## 2023-04-20 ENCOUNTER — Ambulatory Visit: Payer: BC Managed Care – PPO | Admitting: Psychology

## 2023-05-04 ENCOUNTER — Ambulatory Visit: Payer: BC Managed Care – PPO | Admitting: Psychology

## 2023-05-18 ENCOUNTER — Ambulatory Visit: Payer: BC Managed Care – PPO | Admitting: Psychology

## 2023-06-01 ENCOUNTER — Ambulatory Visit: Payer: BC Managed Care – PPO | Admitting: Psychology

## 2023-06-22 ENCOUNTER — Ambulatory Visit: Payer: BC Managed Care – PPO | Admitting: Psychology

## 2023-07-06 ENCOUNTER — Ambulatory Visit: Payer: BC Managed Care – PPO | Admitting: Psychology

## 2023-07-20 ENCOUNTER — Ambulatory Visit: Payer: BC Managed Care – PPO | Admitting: Psychology

## 2023-08-03 ENCOUNTER — Ambulatory Visit: Payer: BC Managed Care – PPO | Admitting: Psychology

## 2023-10-04 ENCOUNTER — Other Ambulatory Visit: Payer: Self-pay | Admitting: Neurology

## 2023-10-04 DIAGNOSIS — G40009 Localization-related (focal) (partial) idiopathic epilepsy and epileptic syndromes with seizures of localized onset, not intractable, without status epilepticus: Secondary | ICD-10-CM

## 2023-10-31 ENCOUNTER — Ambulatory Visit: Payer: BC Managed Care – PPO | Admitting: Neurology

## 2023-10-31 ENCOUNTER — Encounter: Payer: Self-pay | Admitting: Neurology

## 2023-10-31 VITALS — BP 155/86 | HR 81 | Ht 59.0 in | Wt 144.6 lb

## 2023-10-31 DIAGNOSIS — G5622 Lesion of ulnar nerve, left upper limb: Secondary | ICD-10-CM | POA: Diagnosis not present

## 2023-10-31 DIAGNOSIS — G40009 Localization-related (focal) (partial) idiopathic epilepsy and epileptic syndromes with seizures of localized onset, not intractable, without status epilepticus: Secondary | ICD-10-CM | POA: Diagnosis not present

## 2023-10-31 MED ORDER — LEVETIRACETAM ER 500 MG PO TB24: ORAL_TABLET | ORAL | 4 refills | Status: AC

## 2023-10-31 NOTE — Progress Notes (Addendum)
 NEUROLOGY FOLLOW UP OFFICE NOTE  Billy [name] [id number] 02/19/1976  HISTORY OF PRESENT ILLNESS: I had the pleasure of seeing Billy [name] in follow-up in the neurology clinic on 10/31/2023.  The patient was last seen 2 years ago for seizures. He has a history of relatively stereotyped episodes concerning for simple partial seizures with intrusive thoughts, deja vu sensation. He had unwitnessed events in 2015 and March 2017 concerning for seizure waking up with urinary incontinence. He was having daily focal seizures without impaired awareness in 10/2016 in the setting of increased stress, Keppra XR increased to 500mg  in AM, 1000mg  in PM. He has been doing well since then with no further seizures or seizure-like symptoms since 2018. He denies any staring/unresponsive episodes, gaps in time, deja vu, olfactory/gustatory hallucinations, focal weakness, myoclonic jerks. He has occasional headaches, 2 weekends ago he had a constant dull ache that was not responding to his usual Advil. They were not debilitating, no nausea/vomiting. He had to buy aspirin and headache eventually subsided. He has mild vertigo after cleaning his ears and tilting his head back, or after spinning on a chair a little. He had constant tingling on the left 5th digit for 1-2 weeks, then noticed it was more with certain positions. This has quieted down in the past 2-3 months. He sleeps well. Mood is better overall compared a year ago, but his motivation has plummeted. He has no motivation to go to the gym or travel like before. Memory issues are not worse, he gets distracted if someone comes in a room while he is doing something, he is not sure if he finished the job or not. He denies getting lost driving, no missed medications or bills. 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 He reports that symptoms started in 2001 and have been fairly stereotyped since then. He would feel that "something just clicks," then he would have intrusive thoughts that he either dreamt of the the past, thoughts he had suppressed and would start remembering, or things that had happened when he was a child. This would last for 30-45 seconds, then feels his heart rate increase, his whole body starts warming up and he breaks into a sweat, then has a sensation to urinate. People around him would not notice the episode, he denies any confusion or unresponsiveness. Most of the time he can stay focused, but other times he does not feel completely focused and has had to excuse himself it they happened at work. He recalls the first event occurred [id number], he had not slept for more than 2 days, he recalls feeling a little confused driving in the dark, he could not see anything and this freaked him out. He had another again while driving and had to pull over, then vomited. He denies any further episodes of vomiting since then. Initially, he was having the episodes 1-2 times a year, but then increased in frequency to once a month, clustering for 2-3 days. He would have 1-2 episodes the first day, then 3-5 episodes the next day, tapering down to 1-2 episodes on day 3, then stopping for another 30 days. Last episode was 02/17/15. After the first 2 episodes, he can have a  mild 4/10 pressure-like headache in the frontal regions, with good response to Advil. He cannot identify any clear triggers, he denies any sleep deprivation and rarely drinks alcohol. He had previously been evaluated at [facility] by Dr. Kathrynn Ducking in 2007. Per notes, he was seen by [facility] in 2003 and had an EEG reported as normal. He was told he might have migraines and was started on Zonegran, which caused  lethargy. At that time, symptoms were felt to be due to anxiety attacks and he was referred to [facility], however it appears he did not follow through on this. He reports that he is very laid-back and denies anxiety. He did not seek any medical attention for these stereotyped symptoms for 9 years, until January 2016 when he reported waking up with tongue bite with blood on his pillow and urinary incontinence twice at the end of 2015. He was started on uptitration of Trileptal, currently on 600mg  BID. He denies any change in the frequency or duration of these events since starting medication. He feels that since starting Trileptal, he has been having more cognitive problems with difficulties multitasking, and being more forgetful. He has always been easily distractible, but recently has forgotten to save pictures he does at work, one time he left a medication on the table. He forgets to grab certain things when getting ready in the morning. He has also started having dizziness 45-60 minutes after taking Trileptal. He initially had tingling in both hands, which is not as bad but still occurring. He has noticed brief twitching in his extremities, which did not occur prior to starting Trileptal. He would be using the computer mouse then have a brief jerk, but more noticeable in his hands or arms when lying down.   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 His right leg has limited movement in the foot. There is no history of febrile convulsions, CNS infections such as meningitis/encephalitis, significant traumatic brain injury, or family history of seizures.  Prior AEDs: Zonisamide, Trileptal EEGs: normal in 2003 per report MRI: I personally reviewed MRI brain with and without contrast done 11/2014 which did not show any  acute abnormalities, hippocampi symmetric with no abnormal signal or enhancement. There was mild prominence of the lateral ventricles, felt to be normal anatomic variation.  PAST MEDICAL HISTORY: Past Medical History:  Diagnosis Date   Hypertension    Scoliosis    Seizures (HCC)     MEDICATIONS: Current Outpatient Medications on File Prior to Visit  Medication Sig Dispense Refill   amLODipine (NORVASC) 10 MG tablet Take 10 mg by mouth daily.     hydrochlorothiazide (MICROZIDE) 12.5 MG capsule Take 12.5 mg by mouth daily.  1   ibuprofen (ADVIL) 800 MG tablet Take 800 mg by mouth 3 (three) times daily.     levETIRAcetam (KEPPRA XR) 500 MG 24 hr tablet TAKE 1 TABLET BY MOUTH EVERY MORNING AND 2 TABLETS AT NIGHT 270 tablet 0   meloxicam (MOBIC) 7.5 MG tablet Take 7.5 mg by mouth daily.     No current facility-administered medications on file prior to visit.    ALLERGIES: Allergies  Allergen Reactions   Advil Pm [Ibuprofen-Diphenhydramine Cit]     FAMILY HISTORY: No family history on file.  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  Tobacco Use   Smoking status: Never   Smokeless tobacco: Never  Vaping Use   Vaping status: Never Used  Substance and Sexual Activity   Alcohol use: Yes    Alcohol/week: 0.0 standard drinks of alcohol    Comment: Rare   Drug use: No   Sexual activity: Not on file  Other Topics Concern   Not on file  Social History Narrative   Right handed    Social Drivers of Health   Financial Resource Strain: Low Risk  (07/09/2019)   [facility] visits prior to 10/01/2022., [facility] visits prior to 10/01/2022.   Overall Financial Resource Strain (CARDIA)    Difficulty of  Paying Living Expenses: Not very hard  Food Insecurity: Low Risk  (09/01/2023)   [facility]   Hunger Vital Sign    Worried About Running Out of Food in the Last Year: Never true    Ran Out of Food in the Last Year: Never true  Transportation Needs: No Transportation Needs (09/01/2023)   Received from [facility]    In the past 12 months, has lack of reliable transportation kept you from medical appointments, meetings, work or from getting things needed for daily living? : No  Physical Activity: Sufficiently Active (07/09/2019)   Received from [facility] visits prior to 10/01/2022., [facility] visits prior to 10/01/2022.   Exercise Vital Sign    Days of Exercise per Week: 3 days    Minutes of Exercise per Session: 60 min  Stress: No Stress Concern Present (07/09/2019)   [facility] prior to 10/01/2022., [facility] visits prior to 10/01/2022.   [employer] of Occupational Health - Occupational Stress Questionnaire    Feeling of Stress : Not at all  Social Connections: Socially Isolated (07/09/2019)   Received from [facility] visits prior to 10/01/2022., [facility] visits prior to 10/01/2022.   Social Advertising account executive [NHANES]    Frequency of Communication with Friends and Family: More than three times a week    Frequency of Social Gatherings with Friends and Family: Once a week    Attends Religious Services: Never    Database administrator or Organizations: No    Attends Banker Meetings: Never    Marital Status: Never married  Intimate Partner Violence: Not At Risk (07/09/2019)   [facility] prior to 10/01/2022., [facility] visits prior to 10/01/2022.   Humiliation, Afraid, Rape, and Kick questionnaire    Fear of Current  or Ex-Partner: No    Emotionally Abused: No    Physically Abused: No    Sexually Abused: No     PHYSICAL EXAM: Vitals:   10/31/23 0820  BP: (!) 155/86  Pulse: 81  SpO2: 100%   General: No acute distress Head:  Normocephalic/atraumatic Skin/Extremities: No rash, no edema Neurological Exam: alert and awake. No aphasia or dysarthria. Fund of knowledge is appropriate.   Attention and concentration are normal.   Cranial nerves: Pupils equal, round. Extraocular movements intact with no nystagmus. Visual fields  full.  No facial asymmetry.  Motor: Bulk and tone normal, muscle strength 5/5 on both UE, 5/5 proximal LE. Finger to nose testing intact.  Gait wide based with steppage gait, right leg more flexed. No ataxia. +Tinel sign at left elbow and wrist   IMPRESSION: This is a pleasant 48 yo RH man with a history of severe scoliosis with bilateral leg deformities, with a history of relatively stereotyped episodes concerning for simple partial seizures. He has had infrequent episodes of waking up with tongue bite and urinary incontinence. No seizures since 2018, continue Levetiracetam ER 500mg  in AM, 1000mg  in PM. We discussed ulnar neuropathy, advised to use an elbow brace to prevent compression. He is aware of [place] driving laws to stop driving after a seizure until 6 months seizure-free. Follow-up in 1 year, call for any changes.    Thank you for allowing me to participate in his care.  Please do not hesitate to call for any questions or concerns.    Patrcia Dolly, M.D.   CC: [facility]

## 2023-10-31 NOTE — Addendum Note (Signed)
 Addended by: Van Clines on: 10/31/2023 10:04 AM   Modules accepted: Level of Service

## 2023-10-31 NOTE — Patient Instructions (Signed)
 Always a pleasure to see you. Continue Keppra XR 500mg : take 1 tablet in AM, 2 tablets in PM. Follow-up in 1 year, call for any changes.   Seizure Precautions: 1. If medication has been prescribed for you to prevent seizures, take it exactly as directed.  Do not stop taking the medicine without talking to your doctor first, even if you have not had a seizure in a long time.   2. Avoid activities in which a seizure would cause danger to yourself or to others.  Don't operate dangerous machinery, swim alone, or climb in high or dangerous places, such as on ladders, roofs, or girders.  Do not drive unless your doctor says you may.  3. If you have any warning that you may have a seizure, lay down in a safe place where you can't hurt yourself.    4.  No driving for 6 months from last seizure, as per [facility].   Please refer to the following link on the [employer]'s website for more information: http://www.epilepsyfoundation.org/answerplace/Social/driving/drivingu.cfm   5.  Maintain good sleep hygiene. Avoid alcohol.  6.  Contact your doctor if you have any problems that may be related to the medicine you are taking.  7.  Call 911 and bring the patient back to the ED if:        A.  The seizure lasts longer than 5 minutes.       B.  The patient doesn't awaken shortly after the seizure  C.  The patient has new problems such as difficulty seeing, speaking or moving  D.  The patient was injured during the seizure  E.  The patient has a temperature over 102 F (39C)  F.  The patient vomited and now is having trouble breathing

## 2024-06-13 ENCOUNTER — Encounter: Payer: Self-pay | Admitting: Neurology

## 2024-10-28 ENCOUNTER — Ambulatory Visit: Admitting: Neurology
